# Patient Record
Sex: Female | Born: 1970
Health system: Southern US, Community
[De-identification: ages and names within clinical notes are randomized; demographics above are authoritative.]

## PROBLEM LIST (undated history)

## (undated) DIAGNOSIS — I4719 Other supraventricular tachycardia: Secondary | ICD-10-CM

## (undated) DIAGNOSIS — Z9889 Other specified postprocedural states: Secondary | ICD-10-CM

## (undated) DIAGNOSIS — R87619 Unspecified abnormal cytological findings in specimens from cervix uteri: Secondary | ICD-10-CM

## (undated) DIAGNOSIS — R112 Nausea with vomiting, unspecified: Secondary | ICD-10-CM

## (undated) DIAGNOSIS — I471 Supraventricular tachycardia: Secondary | ICD-10-CM

## (undated) DIAGNOSIS — IMO0002 Reserved for concepts with insufficient information to code with codable children: Secondary | ICD-10-CM

## (undated) HISTORY — PX: WISDOM TOOTH EXTRACTION: SHX21

## (undated) HISTORY — DX: Unspecified abnormal cytological findings in specimens from cervix uteri: R87.619

## (undated) HISTORY — PX: PERINEOPLASTY: SHX2218

## (undated) HISTORY — DX: Other supraventricular tachycardia: I47.19

## (undated) HISTORY — DX: Reserved for concepts with insufficient information to code with codable children: IMO0002

## (undated) HISTORY — PX: LEEP: SHX91

## (undated) HISTORY — DX: Supraventricular tachycardia: I47.1

---

## 1998-07-12 ENCOUNTER — Other Ambulatory Visit: Admission: RE | Admit: 1998-07-12 | Discharge: 1998-07-12 | Payer: Self-pay | Admitting: Family Medicine

## 1999-08-18 ENCOUNTER — Other Ambulatory Visit: Admission: RE | Admit: 1999-08-18 | Discharge: 1999-08-18 | Payer: Self-pay | Admitting: Family Medicine

## 2000-08-23 ENCOUNTER — Other Ambulatory Visit: Admission: RE | Admit: 2000-08-23 | Discharge: 2000-08-23 | Payer: Self-pay | Admitting: Family Medicine

## 2001-06-27 ENCOUNTER — Inpatient Hospital Stay (HOSPITAL_COMMUNITY): Admission: AD | Admit: 2001-06-27 | Discharge: 2001-06-27 | Payer: Self-pay | Admitting: Obstetrics and Gynecology

## 2001-09-02 ENCOUNTER — Inpatient Hospital Stay (HOSPITAL_COMMUNITY): Admission: AD | Admit: 2001-09-02 | Discharge: 2001-09-05 | Payer: Self-pay | Admitting: Obstetrics and Gynecology

## 2001-10-29 ENCOUNTER — Other Ambulatory Visit: Admission: RE | Admit: 2001-10-29 | Discharge: 2001-10-29 | Payer: Self-pay | Admitting: Obstetrics and Gynecology

## 2001-11-19 ENCOUNTER — Ambulatory Visit (HOSPITAL_COMMUNITY): Admission: RE | Admit: 2001-11-19 | Discharge: 2001-11-19 | Payer: Self-pay | Admitting: Obstetrics and Gynecology

## 2002-12-11 ENCOUNTER — Other Ambulatory Visit: Admission: RE | Admit: 2002-12-11 | Discharge: 2002-12-11 | Payer: Self-pay | Admitting: Obstetrics and Gynecology

## 2003-11-23 ENCOUNTER — Other Ambulatory Visit: Admission: RE | Admit: 2003-11-23 | Discharge: 2003-11-23 | Payer: Self-pay | Admitting: Obstetrics and Gynecology

## 2005-04-06 ENCOUNTER — Other Ambulatory Visit: Admission: RE | Admit: 2005-04-06 | Discharge: 2005-04-06 | Payer: Self-pay | Admitting: Obstetrics and Gynecology

## 2006-07-08 ENCOUNTER — Ambulatory Visit (HOSPITAL_COMMUNITY): Admission: RE | Admit: 2006-07-08 | Discharge: 2006-07-08 | Payer: Self-pay | Admitting: Obstetrics and Gynecology

## 2010-09-12 ENCOUNTER — Other Ambulatory Visit: Payer: Self-pay | Admitting: Obstetrics and Gynecology

## 2010-09-12 DIAGNOSIS — N6310 Unspecified lump in the right breast, unspecified quadrant: Secondary | ICD-10-CM

## 2010-09-18 ENCOUNTER — Ambulatory Visit
Admission: RE | Admit: 2010-09-18 | Discharge: 2010-09-18 | Disposition: A | Discharge status: Home / Self Care | Payer: BC Managed Care – PPO | Source: Ambulatory Visit | Attending: Obstetrics and Gynecology | Admitting: Obstetrics and Gynecology

## 2010-09-18 DIAGNOSIS — N6310 Unspecified lump in the right breast, unspecified quadrant: Secondary | ICD-10-CM

## 2011-09-13 ENCOUNTER — Ambulatory Visit: Payer: Self-pay | Admitting: Obstetrics and Gynecology

## 2011-10-25 ENCOUNTER — Encounter: Payer: Self-pay | Admitting: Obstetrics and Gynecology

## 2011-10-25 ENCOUNTER — Ambulatory Visit (INDEPENDENT_AMBULATORY_CARE_PROVIDER_SITE_OTHER): Payer: BC Managed Care – PPO | Admitting: Obstetrics and Gynecology

## 2011-10-25 VITALS — BP 104/64 | Ht 61.6 in | Wt 139.0 lb

## 2011-10-25 DIAGNOSIS — Z01419 Encounter for gynecological examination (general) (routine) without abnormal findings: Secondary | ICD-10-CM

## 2011-10-25 NOTE — Progress Notes (Signed)
The patient reports:normal menses, no abnormal bleeding, pelvic pain or discharge  Contraception:IUD mirena  12/2006  Last mammogram: was normal 2012 Last pap: was normal June  2012  GC/Chlamydia cultures offered: declined HIV/RPR/HbsAg offered:  declined HSV 1 and 2 glycoprotein offered: declined  Menstrual cycle regular and monthly: Yes Menstrual flow normal: Yes  Urinary symptoms: none Normal bowel movements: Yes Reports abuse at home: No:   Subjective:    Carol Conway is a 41 y.o. female, G1P0, who presents for an annual exam. Mirena expires in October 2013: desires new Mirena    History   Social History  . Marital Status: Married    Spouse Name: N/A    Number of Children: N/A  . Years of Education: N/A   Social History Main Topics  . Smoking status: Never Smoker   . Smokeless tobacco: Never Used  . Alcohol Use: No  . Drug Use: No  . Sexually Active: Yes    Birth Control/ Protection: IUD   Other Topics Concern  . None   Social History Narrative  . None    Menstrual cycle:   LMP: Patient's last menstrual period was 10/22/2011.           Cycle: a few days of bloody show with occ PCB  The following portions of the patient's history were reviewed and updated as appropriate: allergies, current medications, past family history, past medical history, past social history, past surgical history and problem list.  Review of Systems Pertinent items are noted in HPI. Breast:Negative for breast lump,nipple discharge or nipple retraction Gastrointestinal: Negative for abdominal pain, change in bowel habits or rectal bleeding Urinary:negative   Objective:    BP 104/64  Ht 5' 1.6" (1.565 m)  Wt 139 lb (63.05 kg)  BMI 25.75 kg/m2  LMP 10/22/2011    Weight:  Wt Readings from Last 1 Encounters:  10/25/11 139 lb (63.05 kg)          BMI: Body mass index is 25.75 kg/(m^2).  General Appearance: Alert, appropriate appearance for age. No acute distress HEENT: Grossly  normal Neck / Thyroid: Supple, no masses, nodes or enlargement Lungs: clear to auscultation bilaterally Back: No CVA tenderness Breast Exam: No masses or nodes.No dimpling, nipple retraction or discharge. Cardiovascular: Regular rate and rhythm. S1, S2, no murmur Gastrointestinal: Soft, non-tender, no masses or organomegaly Pelvic Exam: Vulva and vagina appear normal. Bimanual exam reveals normal uterus and adnexa. Strings + Rectovaginal: not indicated Lymphatic Exam: Non-palpable nodes in neck, clavicular, axillary, or inguinal regions Skin: no rash or abnormalities Neurologic: Normal gait and speech, no tremor  Psychiatric: Alert and oriented, appropriate affect.     Assessment:    Normal gyn exam  USI   Plan:    pap smear return annually or prn Kegels recommended STD screening: declined Contraception:will schedule IUD removal and insertion in September      [facility]

## 2011-10-25 NOTE — Patient Instructions (Signed)
Kegels exercise sheet

## 2011-10-26 LAB — PAP IG W/ RFLX HPV ASCU

## 2011-11-01 ENCOUNTER — Ambulatory Visit: Payer: Self-pay | Admitting: Obstetrics and Gynecology

## 2011-12-04 ENCOUNTER — Encounter: Payer: Self-pay | Admitting: Obstetrics and Gynecology

## 2011-12-04 ENCOUNTER — Ambulatory Visit (INDEPENDENT_AMBULATORY_CARE_PROVIDER_SITE_OTHER): Payer: BC Managed Care – PPO | Admitting: Obstetrics and Gynecology

## 2011-12-04 VITALS — BP 104/74 | Wt 144.0 lb

## 2011-12-04 DIAGNOSIS — Z30433 Encounter for removal and reinsertion of intrauterine contraceptive device: Secondary | ICD-10-CM

## 2011-12-04 DIAGNOSIS — Z975 Presence of (intrauterine) contraceptive device: Secondary | ICD-10-CM

## 2011-12-04 LAB — POCT URINE PREGNANCY: Preg Test, Ur: NEGATIVE

## 2011-12-04 MED ORDER — LEVONORGESTREL 20 MCG/24HR IU IUD
INTRAUTERINE_SYSTEM | Freq: Once | INTRAUTERINE | Status: AC
  Administered 2011-12-04: 1 via INTRAUTERINE

## 2011-12-04 NOTE — Progress Notes (Signed)
IUD INSERTION NOTE   Consent signed after risks and benefits were reviewed including but not limited to bleeding, infection and risk of uterine perforation.  LMP: No LMP recorded. Patient is not currently having periods (Reason: IUD). UPT: Negative GC / Chlamydia: negative  Prepping with Betadine Tenaculum placed on anterior lip of cervix Uterus sounded at  7 cm Insertion of MIRENA IUD per protocol without any complications  POST-PROCEDURE:  Patient instructed to call with fever or excessive pain Patient instructed to check IUD strings after each menstrual cycle   Follow-up: 2 weeks   Lanna Poche MD 12/04/2011 8:45 AM

## 2011-12-20 ENCOUNTER — Encounter: Payer: Self-pay | Admitting: Obstetrics and Gynecology

## 2011-12-20 ENCOUNTER — Ambulatory Visit (INDEPENDENT_AMBULATORY_CARE_PROVIDER_SITE_OTHER): Payer: BC Managed Care – PPO | Admitting: Obstetrics and Gynecology

## 2011-12-20 VITALS — BP 110/68 | Wt 146.0 lb

## 2011-12-20 DIAGNOSIS — Z30431 Encounter for routine checking of intrauterine contraceptive device: Secondary | ICD-10-CM

## 2011-12-20 NOTE — Progress Notes (Signed)
IUD SURVEILLANCE NOTE  MIRENA IUD inserted on 12-04-11  Pain: none Bleeding: none Patient has checked IUD strings: no Other complaints: no  EXAM:  IUD strings visualized: yes              Pelvic exam normal: yes  Next visit with annual exam or 10/2012   Larwance Rote MD 12/20/2011 8:31 AM

## 2012-05-12 ENCOUNTER — Telehealth: Payer: Self-pay | Admitting: Obstetrics and Gynecology

## 2012-05-12 NOTE — Telephone Encounter (Signed)
Spoke with pt rgd concerns pt c/o red swollen painful breast wants eval with SR pt has appt 05/13/12 at 3:40 with SR ok per HD pt voice understanding

## 2012-05-13 ENCOUNTER — Encounter: Payer: Self-pay | Admitting: Obstetrics and Gynecology

## 2012-05-13 ENCOUNTER — Ambulatory Visit: Payer: BC Managed Care – PPO | Admitting: Obstetrics and Gynecology

## 2012-05-13 VITALS — BP 118/60 | Ht 61.0 in | Wt 146.0 lb

## 2012-05-13 DIAGNOSIS — N644 Mastodynia: Secondary | ICD-10-CM

## 2012-05-13 NOTE — Progress Notes (Signed)
Subjective:    Carol Conway is a 42 y.o. female, G1P1001, who presents for a swollen left breast x 4 days. Pt states started period last Monday and noticed when exercising it was painful and then after her shower noticed it was redness on skin on the bottom . Also states that left breast was engorged. Pt also felt swollen lymph node. Pt took Ibuprofen which helped some and redness has eased up. States that she could still see redness yesterday but unable to see as much today.   The following portions of the patient's history were reviewed and updated as appropriate: allergies, current medications, past family history.  Review of Systems Pertinent items are noted in HPI.  Objective:    BP 118/60  Ht 5\' 1"  (1.549 m)  Wt 146 lb (66.225 kg)  BMI 27.6 kg/m2    Weight:  Wt Readings from Last 1 Encounters:  05/13/12 146 lb (66.225 kg)          BMI: Body mass index is 27.6 kg/(m^2).  General Appearance: Alert, appropriate appearance for age. No acute distress Breast exam: Right breast normal. Left breast with 4x4 cm thickened area from 9 to 12 o'clock   Assessment:    Breast thickening    Plan:    Diagnostic mammogram & U/S left breast @ [facility]   Silverio Lay MD

## 2012-05-16 ENCOUNTER — Telehealth: Payer: Self-pay

## 2012-05-16 NOTE — Telephone Encounter (Signed)
Message copied by Darien Ramus on Fri May 16, 2012  2:06 PM ------      Message from: Silverio Lay      Created: Fri May 16, 2012 12:06 PM       It is fine      ----- Message -----         From: Darien Ramus, CMA         Sent: 05/15/2012   8:32 AM           To: Esmeralda Arthur, MD            Pt has been scheduled at the breast center for 1st available diagnostic mmg and u/s of left breast. Apt is on Friday 05/23/2012 @ 8:50 a.m. Pt is wanting to know if this is ok or do we need to try to get her in sooner.            Thanks      Zareya Tuckett D.        ------

## 2012-05-16 NOTE — Telephone Encounter (Signed)
Per SR apt that is scheduled is fine. MB will advise pt  Darien Ramus, CMA

## 2012-05-23 ENCOUNTER — Telehealth: Payer: Self-pay

## 2012-05-23 ENCOUNTER — Other Ambulatory Visit: Payer: Self-pay

## 2012-05-23 NOTE — Telephone Encounter (Signed)
E-mail from pt states that her mmg was rescheduled until 06/04/12. Pt had concerns and wanted to make sure this was ok with SR. After speaking with SR pt was advised that it is ok to wait until then.   Pt voiced understanding  Darien Ramus, CMA

## 2012-06-04 ENCOUNTER — Ambulatory Visit
Admission: RE | Admit: 2012-06-04 | Discharge: 2012-06-04 | Disposition: A | Discharge status: Home / Self Care | Payer: BC Managed Care – PPO | Source: Ambulatory Visit | Attending: Obstetrics and Gynecology | Admitting: Obstetrics and Gynecology

## 2012-06-04 ENCOUNTER — Other Ambulatory Visit: Payer: Self-pay | Admitting: Obstetrics and Gynecology

## 2012-06-04 DIAGNOSIS — N644 Mastodynia: Secondary | ICD-10-CM

## 2013-05-26 ENCOUNTER — Other Ambulatory Visit: Payer: Self-pay

## 2013-05-26 DIAGNOSIS — Z1231 Encounter for screening mammogram for malignant neoplasm of breast: Secondary | ICD-10-CM

## 2013-06-10 ENCOUNTER — Ambulatory Visit
Admission: RE | Admit: 2013-06-10 | Discharge: 2013-06-10 | Disposition: A | Discharge status: Home / Self Care | Payer: BC Managed Care – PPO | Source: Ambulatory Visit

## 2013-06-10 DIAGNOSIS — Z1231 Encounter for screening mammogram for malignant neoplasm of breast: Secondary | ICD-10-CM

## 2014-01-18 ENCOUNTER — Encounter: Payer: Self-pay | Admitting: Obstetrics and Gynecology

## 2015-03-07 ENCOUNTER — Other Ambulatory Visit: Payer: Self-pay | Admitting: Obstetrics and Gynecology

## 2015-03-07 DIAGNOSIS — N63 Unspecified lump in unspecified breast: Secondary | ICD-10-CM

## 2015-03-07 DIAGNOSIS — N6001 Solitary cyst of right breast: Secondary | ICD-10-CM

## 2015-03-11 ENCOUNTER — Other Ambulatory Visit: Payer: Self-pay | Admitting: Obstetrics and Gynecology

## 2015-03-11 ENCOUNTER — Ambulatory Visit
Admission: RE | Admit: 2015-03-11 | Discharge: 2015-03-11 | Disposition: A | Discharge status: Home / Self Care | Payer: BLUE CROSS/BLUE SHIELD | Source: Ambulatory Visit | Attending: Obstetrics and Gynecology | Admitting: Obstetrics and Gynecology

## 2015-03-11 DIAGNOSIS — N63 Unspecified lump in unspecified breast: Secondary | ICD-10-CM

## 2015-03-11 DIAGNOSIS — N6320 Unspecified lump in the left breast, unspecified quadrant: Secondary | ICD-10-CM

## 2015-03-11 DIAGNOSIS — N6001 Solitary cyst of right breast: Secondary | ICD-10-CM

## 2015-03-20 HISTORY — PX: BREAST CYST ASPIRATION: SHX578

## 2015-03-23 ENCOUNTER — Ambulatory Visit
Admission: RE | Admit: 2015-03-23 | Discharge: 2015-03-23 | Disposition: A | Discharge status: Home / Self Care | Payer: BLUE CROSS/BLUE SHIELD | Source: Ambulatory Visit | Attending: Obstetrics and Gynecology | Admitting: Obstetrics and Gynecology

## 2015-03-23 DIAGNOSIS — N6001 Solitary cyst of right breast: Secondary | ICD-10-CM

## 2015-03-23 DIAGNOSIS — N63 Unspecified lump in unspecified breast: Secondary | ICD-10-CM

## 2015-03-26 LAB — CULTURE, ROUTINE-ABSCESS
CULTURE: NO GROWTH
SPECIAL REQUESTS: NORMAL

## 2016-01-03 ENCOUNTER — Other Ambulatory Visit: Payer: Self-pay | Admitting: Obstetrics and Gynecology

## 2016-01-03 DIAGNOSIS — Z1231 Encounter for screening mammogram for malignant neoplasm of breast: Secondary | ICD-10-CM

## 2016-03-27 ENCOUNTER — Ambulatory Visit
Admission: RE | Admit: 2016-03-27 | Discharge: 2016-03-27 | Disposition: A | Discharge status: Home / Self Care | Payer: BLUE CROSS/BLUE SHIELD | Source: Ambulatory Visit | Attending: Obstetrics and Gynecology | Admitting: Obstetrics and Gynecology

## 2016-03-27 DIAGNOSIS — Z1231 Encounter for screening mammogram for malignant neoplasm of breast: Secondary | ICD-10-CM

## 2016-11-01 DIAGNOSIS — Z113 Encounter for screening for infections with a predominantly sexual mode of transmission: Secondary | ICD-10-CM | POA: Diagnosis not present

## 2016-11-01 DIAGNOSIS — N6320 Unspecified lump in the left breast, unspecified quadrant: Secondary | ICD-10-CM | POA: Diagnosis not present

## 2016-11-01 DIAGNOSIS — Z30433 Encounter for removal and reinsertion of intrauterine contraceptive device: Secondary | ICD-10-CM | POA: Diagnosis not present

## 2016-11-15 DIAGNOSIS — N63 Unspecified lump in unspecified breast: Secondary | ICD-10-CM | POA: Diagnosis not present

## 2016-11-15 DIAGNOSIS — Z30431 Encounter for routine checking of intrauterine contraceptive device: Secondary | ICD-10-CM | POA: Diagnosis not present

## 2017-01-18 DIAGNOSIS — Z23 Encounter for immunization: Secondary | ICD-10-CM | POA: Diagnosis not present

## 2017-03-25 DIAGNOSIS — N6019 Diffuse cystic mastopathy of unspecified breast: Secondary | ICD-10-CM | POA: Diagnosis not present

## 2017-03-25 DIAGNOSIS — Z6830 Body mass index (BMI) 30.0-30.9, adult: Secondary | ICD-10-CM | POA: Diagnosis not present

## 2017-03-25 DIAGNOSIS — Z01411 Encounter for gynecological examination (general) (routine) with abnormal findings: Secondary | ICD-10-CM | POA: Diagnosis not present

## 2017-03-25 DIAGNOSIS — Z3040 Encounter for surveillance of contraceptives, unspecified: Secondary | ICD-10-CM | POA: Diagnosis not present

## 2017-03-26 ENCOUNTER — Other Ambulatory Visit: Payer: Self-pay | Admitting: Obstetrics and Gynecology

## 2017-03-26 DIAGNOSIS — N6001 Solitary cyst of right breast: Secondary | ICD-10-CM

## 2017-03-26 DIAGNOSIS — N6002 Solitary cyst of left breast: Secondary | ICD-10-CM

## 2017-04-01 ENCOUNTER — Other Ambulatory Visit: Payer: BLUE CROSS/BLUE SHIELD

## 2017-05-17 HISTORY — PX: BREAST BIOPSY: SHX20

## 2017-05-20 ENCOUNTER — Ambulatory Visit
Admission: RE | Admit: 2017-05-20 | Discharge: 2017-05-20 | Disposition: A | Discharge status: Home / Self Care | Payer: BLUE CROSS/BLUE SHIELD | Source: Ambulatory Visit | Attending: Obstetrics and Gynecology | Admitting: Obstetrics and Gynecology

## 2017-05-20 ENCOUNTER — Ambulatory Visit: Admission: RE | Admit: 2017-05-20 | Payer: BLUE CROSS/BLUE SHIELD | Source: Ambulatory Visit

## 2017-05-20 ENCOUNTER — Other Ambulatory Visit: Payer: Self-pay | Admitting: Obstetrics and Gynecology

## 2017-05-20 DIAGNOSIS — N6001 Solitary cyst of right breast: Secondary | ICD-10-CM

## 2017-05-20 DIAGNOSIS — R922 Inconclusive mammogram: Secondary | ICD-10-CM | POA: Diagnosis not present

## 2017-05-20 DIAGNOSIS — N6002 Solitary cyst of left breast: Secondary | ICD-10-CM

## 2017-05-20 DIAGNOSIS — N6320 Unspecified lump in the left breast, unspecified quadrant: Secondary | ICD-10-CM

## 2017-05-24 ENCOUNTER — Ambulatory Visit
Admission: RE | Admit: 2017-05-24 | Discharge: 2017-05-24 | Disposition: A | Discharge status: Home / Self Care | Payer: BLUE CROSS/BLUE SHIELD | Source: Ambulatory Visit | Attending: Obstetrics and Gynecology | Admitting: Obstetrics and Gynecology

## 2017-05-24 DIAGNOSIS — N6002 Solitary cyst of left breast: Secondary | ICD-10-CM | POA: Diagnosis not present

## 2017-05-24 DIAGNOSIS — N6320 Unspecified lump in the left breast, unspecified quadrant: Secondary | ICD-10-CM

## 2017-05-24 DIAGNOSIS — N6012 Diffuse cystic mastopathy of left breast: Secondary | ICD-10-CM | POA: Diagnosis not present

## 2017-05-24 DIAGNOSIS — N6321 Unspecified lump in the left breast, upper outer quadrant: Secondary | ICD-10-CM | POA: Diagnosis not present

## 2017-05-24 DIAGNOSIS — N6323 Unspecified lump in the left breast, lower outer quadrant: Secondary | ICD-10-CM | POA: Diagnosis not present

## 2017-08-06 DIAGNOSIS — H5213 Myopia, bilateral: Secondary | ICD-10-CM | POA: Diagnosis not present

## 2017-12-16 DIAGNOSIS — Z23 Encounter for immunization: Secondary | ICD-10-CM | POA: Diagnosis not present

## 2018-02-25 ENCOUNTER — Other Ambulatory Visit: Payer: Self-pay | Admitting: Obstetrics and Gynecology

## 2018-02-25 DIAGNOSIS — Z1231 Encounter for screening mammogram for malignant neoplasm of breast: Secondary | ICD-10-CM

## 2018-03-26 DIAGNOSIS — L709 Acne, unspecified: Secondary | ICD-10-CM | POA: Diagnosis not present

## 2018-03-26 DIAGNOSIS — D229 Melanocytic nevi, unspecified: Secondary | ICD-10-CM | POA: Diagnosis not present

## 2018-05-22 ENCOUNTER — Ambulatory Visit
Admission: RE | Admit: 2018-05-22 | Discharge: 2018-05-22 | Disposition: A | Discharge status: Home / Self Care | Payer: BLUE CROSS/BLUE SHIELD | Source: Ambulatory Visit | Attending: Obstetrics and Gynecology | Admitting: Obstetrics and Gynecology

## 2018-05-22 DIAGNOSIS — Z1231 Encounter for screening mammogram for malignant neoplasm of breast: Secondary | ICD-10-CM

## 2018-05-23 ENCOUNTER — Other Ambulatory Visit: Payer: Self-pay | Admitting: Obstetrics and Gynecology

## 2018-05-23 DIAGNOSIS — Z1231 Encounter for screening mammogram for malignant neoplasm of breast: Secondary | ICD-10-CM

## 2018-05-23 DIAGNOSIS — Z13220 Encounter for screening for lipoid disorders: Secondary | ICD-10-CM | POA: Diagnosis not present

## 2018-05-23 DIAGNOSIS — Z8249 Family history of ischemic heart disease and other diseases of the circulatory system: Secondary | ICD-10-CM | POA: Diagnosis not present

## 2018-05-23 DIAGNOSIS — N951 Menopausal and female climacteric states: Secondary | ICD-10-CM | POA: Diagnosis not present

## 2018-05-23 DIAGNOSIS — N63 Unspecified lump in unspecified breast: Secondary | ICD-10-CM

## 2018-05-23 DIAGNOSIS — Z0000 Encounter for general adult medical examination without abnormal findings: Secondary | ICD-10-CM | POA: Diagnosis not present

## 2018-06-12 ENCOUNTER — Ambulatory Visit: Payer: BLUE CROSS/BLUE SHIELD

## 2018-07-10 ENCOUNTER — Ambulatory Visit: Payer: BLUE CROSS/BLUE SHIELD

## 2018-08-18 DIAGNOSIS — H5213 Myopia, bilateral: Secondary | ICD-10-CM | POA: Diagnosis not present

## 2018-08-26 ENCOUNTER — Other Ambulatory Visit: Payer: Self-pay

## 2018-08-26 ENCOUNTER — Ambulatory Visit
Admission: RE | Admit: 2018-08-26 | Discharge: 2018-08-26 | Disposition: A | Discharge status: Home / Self Care | Payer: BC Managed Care – PPO | Source: Ambulatory Visit | Attending: Obstetrics and Gynecology | Admitting: Obstetrics and Gynecology

## 2018-08-26 DIAGNOSIS — Z1231 Encounter for screening mammogram for malignant neoplasm of breast: Secondary | ICD-10-CM | POA: Diagnosis not present

## 2018-09-02 DIAGNOSIS — Z23 Encounter for immunization: Secondary | ICD-10-CM | POA: Diagnosis not present

## 2019-02-18 DIAGNOSIS — L309 Dermatitis, unspecified: Secondary | ICD-10-CM | POA: Diagnosis not present

## 2019-04-06 DIAGNOSIS — Z01411 Encounter for gynecological examination (general) (routine) with abnormal findings: Secondary | ICD-10-CM | POA: Diagnosis not present

## 2019-04-06 DIAGNOSIS — Z124 Encounter for screening for malignant neoplasm of cervix: Secondary | ICD-10-CM | POA: Diagnosis not present

## 2019-04-06 DIAGNOSIS — Z6831 Body mass index (BMI) 31.0-31.9, adult: Secondary | ICD-10-CM | POA: Diagnosis not present

## 2019-04-06 DIAGNOSIS — Z3040 Encounter for surveillance of contraceptives, unspecified: Secondary | ICD-10-CM | POA: Diagnosis not present

## 2019-10-08 DIAGNOSIS — H5213 Myopia, bilateral: Secondary | ICD-10-CM | POA: Diagnosis not present

## 2019-12-07 DIAGNOSIS — Z20822 Contact with and (suspected) exposure to covid-19: Secondary | ICD-10-CM | POA: Diagnosis not present

## 2020-03-09 DIAGNOSIS — Z20822 Contact with and (suspected) exposure to covid-19: Secondary | ICD-10-CM | POA: Diagnosis not present

## 2020-03-19 HISTORY — PX: BREAST CYST ASPIRATION: SHX578

## 2020-04-05 DIAGNOSIS — Z1231 Encounter for screening mammogram for malignant neoplasm of breast: Secondary | ICD-10-CM | POA: Diagnosis not present

## 2020-04-19 DIAGNOSIS — Z8249 Family history of ischemic heart disease and other diseases of the circulatory system: Secondary | ICD-10-CM | POA: Diagnosis not present

## 2020-04-19 DIAGNOSIS — R1115 Cyclical vomiting syndrome unrelated to migraine: Secondary | ICD-10-CM | POA: Diagnosis not present

## 2020-04-19 DIAGNOSIS — Z3040 Encounter for surveillance of contraceptives, unspecified: Secondary | ICD-10-CM | POA: Diagnosis not present

## 2020-04-19 DIAGNOSIS — Z1231 Encounter for screening mammogram for malignant neoplasm of breast: Secondary | ICD-10-CM | POA: Diagnosis not present

## 2020-04-19 DIAGNOSIS — R922 Inconclusive mammogram: Secondary | ICD-10-CM | POA: Diagnosis not present

## 2020-04-19 DIAGNOSIS — Z6828 Body mass index (BMI) 28.0-28.9, adult: Secondary | ICD-10-CM | POA: Diagnosis not present

## 2020-04-19 DIAGNOSIS — Z01411 Encounter for gynecological examination (general) (routine) with abnormal findings: Secondary | ICD-10-CM | POA: Diagnosis not present

## 2020-04-19 DIAGNOSIS — N6310 Unspecified lump in the right breast, unspecified quadrant: Secondary | ICD-10-CM | POA: Diagnosis not present

## 2020-04-28 DIAGNOSIS — E669 Obesity, unspecified: Secondary | ICD-10-CM | POA: Diagnosis not present

## 2020-04-28 DIAGNOSIS — R079 Chest pain, unspecified: Secondary | ICD-10-CM | POA: Diagnosis not present

## 2020-04-28 DIAGNOSIS — Z1211 Encounter for screening for malignant neoplasm of colon: Secondary | ICD-10-CM | POA: Diagnosis not present

## 2020-04-28 DIAGNOSIS — R748 Abnormal levels of other serum enzymes: Secondary | ICD-10-CM | POA: Diagnosis not present

## 2020-04-28 DIAGNOSIS — R1013 Epigastric pain: Secondary | ICD-10-CM | POA: Diagnosis not present

## 2020-04-29 ENCOUNTER — Other Ambulatory Visit: Payer: Self-pay | Admitting: Gastroenterology

## 2020-04-29 DIAGNOSIS — R1013 Epigastric pain: Secondary | ICD-10-CM

## 2020-05-02 DIAGNOSIS — N6019 Diffuse cystic mastopathy of unspecified breast: Secondary | ICD-10-CM | POA: Diagnosis not present

## 2020-05-02 DIAGNOSIS — R922 Inconclusive mammogram: Secondary | ICD-10-CM | POA: Diagnosis not present

## 2020-05-02 DIAGNOSIS — Z0000 Encounter for general adult medical examination without abnormal findings: Secondary | ICD-10-CM | POA: Diagnosis not present

## 2020-05-05 ENCOUNTER — Other Ambulatory Visit: Payer: Self-pay | Admitting: Gastroenterology

## 2020-05-05 ENCOUNTER — Ambulatory Visit
Admission: RE | Admit: 2020-05-05 | Discharge: 2020-05-05 | Disposition: A | Discharge status: Home / Self Care | Payer: BC Managed Care – PPO | Source: Ambulatory Visit | Attending: Gastroenterology | Admitting: Gastroenterology

## 2020-05-05 ENCOUNTER — Other Ambulatory Visit: Payer: Self-pay | Admitting: Obstetrics and Gynecology

## 2020-05-05 DIAGNOSIS — R1013 Epigastric pain: Secondary | ICD-10-CM

## 2020-05-05 DIAGNOSIS — K8020 Calculus of gallbladder without cholecystitis without obstruction: Secondary | ICD-10-CM | POA: Diagnosis not present

## 2020-05-11 ENCOUNTER — Ambulatory Visit: Payer: Self-pay | Admitting: Surgery

## 2020-05-11 DIAGNOSIS — K8050 Calculus of bile duct without cholangitis or cholecystitis without obstruction: Secondary | ICD-10-CM | POA: Diagnosis not present

## 2020-05-11 NOTE — H&P (View-Only) (Signed)
Surgical Evaluation  Chief Complaint: abdominal pain  HPI: This is a very pleasant otherwise healthy 50 year old woman who has had 2 episodes of severe epigastric and right upper quadrant pain. The first was last month and lasted for about 3 hours, the second was a few weeks ago and lasted for about 6 hours.  Each episode is associated with chills, nausea, and emesis followed by relief of the pain.  Denies any known fevers, denies any jaundice, denies any preceding symptoms of indigestion or abdominal pain.  She's never had any abdominal surgery.  She did undergo an ultrasound that demonstrated cholelithiasis.  Labs performed last week were unremarkable with the exception of elevation of AST and ALT to around 200-300.  Bilirubin was normal.  Allergies  Allergen Reactions  . Codeine     Past Medical History:  Diagnosis Date  . Abnormal pap     Past Surgical History:  Procedure Laterality Date  . BREAST BIOPSY Left 05/2017  . BREAST CYST ASPIRATION Left 03/2015  . LEEP    . PERINEOPLASTY      Family History  Problem Relation Age of Onset  . Heart disease Father   . Ovarian cancer Maternal Grandmother   . Breast cancer Paternal Grandmother   . Parkinsonism Paternal Grandmother   . Ovarian cancer Paternal Grandmother     Social History   Socioeconomic History  . Marital status: Married    Spouse name: Not on file  . Number of children: Not on file  . Years of education: Not on file  . Highest education level: Not on file  Occupational History  . Not on file  Tobacco Use  . Smoking status: Never Smoker  . Smokeless tobacco: Never Used  Substance and Sexual Activity  . Alcohol use: No  . Drug use: No  . Sexual activity: Yes    Birth control/protection: I.U.D.  Other Topics Concern  . Not on file  Social History Narrative  . Not on file   Social Determinants of Health   Financial Resource Strain: Not on file  Food Insecurity: Not on file  Transportation Needs: Not  on file  Physical Activity: Not on file  Stress: Not on file  Social Connections: Not on file    No current outpatient medications on file prior to visit.   No current facility-administered medications on file prior to visit.    Review of Systems: a complete, 10pt review of systems was completed with pertinent positives and negatives as documented in the HPI  Physical Exam: Vitals Weight: 164.5 lb Height: 64.5in Body Surface Area: 1.81 m Body Mass Index: 27.8 kg/m  Temp.: 98.33F  P.OX: 99% (Room air) BP: 136/82(Sitting, Left Arm, Standard)  Alert and well-appearing Unlabored respirations Abdomen is soft, nontender, nondistended. No mass, no hernia, no organomegaly.   No flowsheet data found.  No flowsheet data found.  No results found for: INR, PROTIME  Imaging: No results found.   A/P: BILIARY COLIC (K80.50) Story: 2 fairly severe episodes. I recommend proceeding with laparoscopic cholecystectomy with possible cholangiogram. Discussed risks of surgery including bleeding, pain, scarring, intraabdominal injury specifically to the common bile duct and sequelae, bile leak, conversion to open surgery, failure to resolve symptoms, blood clots/ pulmonary embolus, heart attack, pneumonia, stroke, death. Questions welcomed and answered to patient's satisfaction. She wishes to proceed with surgery and we will schedule her as soon as possible. We went over signs and symptoms that should prompt her to seek emergency treatment if she has a  recurrent episode in the interim.    There are no problems to display for this patient.      Phylliss Blakes, MD [facility], [place]  See AMION to contact appropriate on-call provider

## 2020-05-11 NOTE — H&P (Signed)
Surgical Evaluation  Chief Complaint: abdominal pain  HPI: This is a very pleasant otherwise healthy 49-year-old woman who has had 2 episodes of severe epigastric and right upper quadrant pain. The first was last month and lasted for about 3 hours, the second was a few weeks ago and lasted for about 6 hours.  Each episode is associated with chills, nausea, and emesis followed by relief of the pain.  Denies any known fevers, denies any jaundice, denies any preceding symptoms of indigestion or abdominal pain.  She's never had any abdominal surgery.  She did undergo an ultrasound that demonstrated cholelithiasis.  Labs performed last week were unremarkable with the exception of elevation of AST and ALT to around 200-300.  Bilirubin was normal.  Allergies  Allergen Reactions  . Codeine     Past Medical History:  Diagnosis Date  . Abnormal pap     Past Surgical History:  Procedure Laterality Date  . BREAST BIOPSY Left 05/2017  . BREAST CYST ASPIRATION Left 03/2015  . LEEP    . PERINEOPLASTY      Family History  Problem Relation Age of Onset  . Heart disease Father   . Ovarian cancer Maternal Grandmother   . Breast cancer Paternal Grandmother   . Parkinsonism Paternal Grandmother   . Ovarian cancer Paternal Grandmother     Social History   Socioeconomic History  . Marital status: Married    Spouse name: Not on file  . Number of children: Not on file  . Years of education: Not on file  . Highest education level: Not on file  Occupational History  . Not on file  Tobacco Use  . Smoking status: Never Smoker  . Smokeless tobacco: Never Used  Substance and Sexual Activity  . Alcohol use: No  . Drug use: No  . Sexual activity: Yes    Birth control/protection: I.U.D.  Other Topics Concern  . Not on file  Social History Narrative  . Not on file   Social Determinants of Health   Financial Resource Strain: Not on file  Food Insecurity: Not on file  Transportation Needs: Not  on file  Physical Activity: Not on file  Stress: Not on file  Social Connections: Not on file    No current outpatient medications on file prior to visit.   No current facility-administered medications on file prior to visit.    Review of Systems: a complete, 10pt review of systems was completed with pertinent positives and negatives as documented in the HPI  Physical Exam: Vitals Weight: 164.5 lb Height: 64.5in Body Surface Area: 1.81 m Body Mass Index: 27.8 kg/m  Temp.: 98.2F  P.OX: 99% (Room air) BP: 136/82(Sitting, Left Arm, Standard)  Alert and well-appearing Unlabored respirations Abdomen is soft, nontender, nondistended. No mass, no hernia, no organomegaly.   No flowsheet data found.  No flowsheet data found.  No results found for: INR, PROTIME  Imaging: No results found.   A/P: BILIARY COLIC (K80.50) Story: 2 fairly severe episodes. I recommend proceeding with laparoscopic cholecystectomy with possible cholangiogram. Discussed risks of surgery including bleeding, pain, scarring, intraabdominal injury specifically to the common bile duct and sequelae, bile leak, conversion to open surgery, failure to resolve symptoms, blood clots/ pulmonary embolus, heart attack, pneumonia, stroke, death. Questions welcomed and answered to patient's satisfaction. She wishes to proceed with surgery and we will schedule her as soon as possible. We went over signs and symptoms that should prompt her to seek emergency treatment if she has a   recurrent episode in the interim.    There are no problems to display for this patient.      Phylliss Blakes, MD [facility], [place]  See AMION to contact appropriate on-call provider

## 2020-05-13 NOTE — Patient Instructions (Addendum)
DUE TO COVID-19 ONLY ONE VISITOR IS ALLOWED TO COME WITH YOU AND STAY IN THE WAITING ROOM ONLY DURING PRE OP AND PROCEDURE.     COVID SWAB TESTING MUST BE COMPLETED ON:  Friday, 05-20-20 @ 1:15 PM   [address] [place], [place] [address]  (Must self quarantine after testing. Follow instructions on handout.)        Your procedure is scheduled on:   Tuesday, 05-24-20   Report to [facility] Main  Entrance   Report to Short Stay at 5:30 AM   [facility])   Call this number if you have problems the morning of surgery [phone]   Do not eat food :After Midnight.   May have liquids until 4:30 AM   day of surgery  CLEAR LIQUID DIET  Foods Allowed                                                                     Foods Excluded  Water, Black Coffee and tea, regular and decaf            liquids that you cannot  Plain Jell-O in any flavor  (No red)                                   see through such as: Fruit ices (not with fruit pulp)                                      milk, soups, orange juice              Iced Popsicles (No red)                                      All solid food                                   Apple juices Sports drinks like Gatorade (No red) Lightly seasoned clear broth or consume(fat free)  Sugar, honey syrup     Oral Hygiene is also important to reduce your risk of infection.                                    Remember - BRUSH YOUR TEETH THE MORNING OF SURGERY WITH YOUR REGULAR TOOTHPASTE   Do NOT smoke after Midnight   Take these medicines the morning of surgery with A SIP OF WATER:   None                               You may not have any metal on your body including hair pins, jewelry, and body piercings             Do not wear make-up, lotions, powders, perfumes/cologne, or deodorant  Do not wear nail polish.  Do not shave  48 hours prior to surgery.             Do not bring valuables to the hospital. [employer] IS NOT              RESPONSIBLE   FOR VALUABLES.   Contacts, dentures or bridgework may not be worn into surgery.     Patients discharged the day of surgery will not be allowed to drive home.                Please read over the following fact sheets you were given: IF YOU HAVE QUESTIONS ABOUT YOUR PRE OP INSTRUCTIONS PLEASE CALL  [phone] [facility] - Preparing for Surgery Before surgery, you can play an important role.  Because skin is not sterile, your skin needs to be as free of germs as possible.  You can reduce the number of germs on your skin by washing with CHG (chlorahexidine gluconate) soap before surgery.  CHG is an antiseptic cleaner which kills germs and bonds with the skin to continue killing germs even after washing. Please DO NOT use if you have an allergy to CHG or antibacterial soaps.  If your skin becomes reddened/irritated stop using the CHG and inform your nurse when you arrive at Short Stay. Do not shave (including legs and underarms) for at least 48 hours prior to the first CHG shower.  You may shave your face/neck.  Please follow these instructions carefully:  1.  Shower with CHG Soap the night before surgery and the  morning of surgery.  2.  If you choose to wash your hair, wash your hair first as usual with your normal  shampoo.  3.  After you shampoo, rinse your hair and body thoroughly to remove the shampoo.                             4.  Use CHG as you would any other liquid soap.  You can apply chg directly to the skin and wash.  Gently with a scrungie or clean washcloth.  5.  Apply the CHG Soap to your body ONLY FROM THE NECK DOWN.   Do   not use on face/ open                           Wound or open sores. Avoid contact with eyes, ears mouth and   genitals (private parts).                       Wash face,  Genitals (private parts) with your normal soap.             6.  Wash thoroughly, paying special attention to the area where your    surgery  will be  performed.  7.  Thoroughly rinse your body with warm water from the neck down.  8.  DO NOT shower/wash with your normal soap after using and rinsing off the CHG Soap.                9.  Pat yourself dry with a clean towel.            10.  Wear clean pajamas.            11.  Place clean sheets on your bed the night of  your first shower and do not  sleep with pets. Day of Surgery : Do not apply any lotions/deodorants the morning of surgery.  Please wear clean clothes to the hospital/surgery center.  FAILURE TO FOLLOW THESE INSTRUCTIONS MAY RESULT IN THE CANCELLATION OF YOUR SURGERY  PATIENT SIGNATURE_________________________________  NURSE SIGNATURE__________________________________  ________________________________________________________________________

## 2020-05-13 NOTE — Progress Notes (Addendum)
COVID Vaccine Completed:  x3 Date COVID Vaccine completed:  06-10-19 07-01-19 Has received booster:  04-30-27-21 COVID vaccine manufacturer: Pfizer      Date of COVID positive in last 90 days:  N/A  PCP - Ollen Gross Cardiologist - N/A  Chest x-ray - N/A EKG - N/A Stress Test -  ECHO -  Cardiac Cath -  Pacemaker/ICD device last checked:  Sleep Study - N/A CPAP -   Fasting Blood Sugar - N/A Checks Blood Sugar _____ times a day  Blood Thinner Instructions: Aspirin Instructions:N/A Last Dose:  Activity level:  Can go up a flight of stairs and perform activities of daily living without stopping and without symptoms.  Able to exercise without symptoms     Anesthesia review:  N/A  Patient denies shortness of breath, fever, cough and chest pain at PAT appointment   Patient verbalized understanding of instructions that were given to them at the PAT appointment. Patient was also instructed that they will need to review over the PAT instructions again at home before surgery.

## 2020-05-16 ENCOUNTER — Other Ambulatory Visit: Payer: BC Managed Care – PPO

## 2020-05-17 ENCOUNTER — Encounter (HOSPITAL_COMMUNITY): Payer: Self-pay

## 2020-05-17 ENCOUNTER — Encounter (HOSPITAL_COMMUNITY)
Admission: RE | Admit: 2020-05-17 | Discharge: 2020-05-17 | Disposition: A | Discharge status: Home / Self Care | Payer: BC Managed Care – PPO | Source: Ambulatory Visit | Attending: Surgery | Admitting: Surgery

## 2020-05-17 ENCOUNTER — Other Ambulatory Visit: Payer: Self-pay

## 2020-05-17 DIAGNOSIS — Z01812 Encounter for preprocedural laboratory examination: Secondary | ICD-10-CM | POA: Insufficient documentation

## 2020-05-17 HISTORY — DX: Nausea with vomiting, unspecified: R11.2

## 2020-05-17 HISTORY — DX: Other specified postprocedural states: Z98.890

## 2020-05-17 LAB — CBC
HCT: 37.9 % (ref 36.0–46.0)
Hemoglobin: 11.9 g/dL — ABNORMAL LOW (ref 12.0–15.0)
MCH: 26 pg (ref 26.0–34.0)
MCHC: 31.4 g/dL (ref 30.0–36.0)
MCV: 82.9 fL (ref 80.0–100.0)
Platelets: 233 10*3/uL (ref 150–400)
RBC: 4.57 MIL/uL (ref 3.87–5.11)
RDW: 14 % (ref 11.5–15.5)
WBC: 8.6 10*3/uL (ref 4.0–10.5)
nRBC: 0 % (ref 0.0–0.2)

## 2020-05-20 ENCOUNTER — Other Ambulatory Visit (HOSPITAL_COMMUNITY)
Admission: RE | Admit: 2020-05-20 | Discharge: 2020-05-20 | Disposition: A | Discharge status: Home / Self Care | Payer: BC Managed Care – PPO | Source: Ambulatory Visit | Attending: Surgery | Admitting: Surgery

## 2020-05-20 ENCOUNTER — Telehealth: Payer: Self-pay

## 2020-05-20 DIAGNOSIS — Z20822 Contact with and (suspected) exposure to covid-19: Secondary | ICD-10-CM | POA: Diagnosis not present

## 2020-05-20 DIAGNOSIS — Z01812 Encounter for preprocedural laboratory examination: Secondary | ICD-10-CM | POA: Diagnosis not present

## 2020-05-20 LAB — SARS CORONAVIRUS 2 (TAT 6-24 HRS): SARS Coronavirus 2: NEGATIVE

## 2020-05-20 NOTE — Telephone Encounter (Signed)
NOTES ON FILE FROM DR SANDRA RIVARD [phone], SENT REFERRAL TO SCHEDULING 

## 2020-05-23 ENCOUNTER — Encounter (HOSPITAL_COMMUNITY): Payer: Self-pay | Admitting: Surgery

## 2020-05-23 MED ORDER — BUPIVACAINE LIPOSOME 1.3 % IJ SUSP
20.0000 mL | Freq: Once | INTRAMUSCULAR | Status: DC
  Filled 2020-05-23: qty 20

## 2020-05-23 NOTE — Anesthesia Preprocedure Evaluation (Addendum)
Anesthesia Evaluation  Patient identified by MRN, date of birth, ID band Patient awake    Reviewed: Allergy & Precautions, NPO status , Patient's Chart, lab work & pertinent test results  History of Anesthesia Complications Negative for: history of anesthetic complications  Airway Mallampati: II  TM Distance: >3 FB Neck ROM: Full    Dental  (+) Dental Advisory Given   Pulmonary former smoker,  05/20/2020 SARS coronavirus NEG   breath sounds clear to auscultation       Cardiovascular negative cardio ROS   Rhythm:Regular Rate:Normal     Neuro/Psych negative neurological ROS     GI/Hepatic negative GI ROS, Biliary cholestasis   Endo/Other  obese  Renal/GU negative Renal ROS     Musculoskeletal   Abdominal (+) + obese,   Peds  Hematology negative hematology ROS (+)   Anesthesia Other Findings   Reproductive/Obstetrics                            Anesthesia Physical Anesthesia Plan  ASA: II  Anesthesia Plan: General   Post-op Pain Management:    Induction: Intravenous and Rapid sequence  PONV Risk Score and Plan: 4 or greater and Scopolamine patch - Pre-op, Ondansetron and Dexamethasone  Airway Management Planned: Oral ETT  Additional Equipment: None  Intra-op Plan:   Post-operative Plan: Extubation in OR  Informed Consent: I have reviewed the patients History and Physical, chart, labs and discussed the procedure including the risks, benefits and alternatives for the proposed anesthesia with the patient or authorized representative who has indicated his/her understanding and acceptance.     Dental advisory given  Plan Discussed with:   Anesthesia Plan Comments:        Anesthesia Quick Evaluation

## 2020-05-24 ENCOUNTER — Ambulatory Visit (HOSPITAL_COMMUNITY): Payer: BC Managed Care – PPO | Admitting: Anesthesiology

## 2020-05-24 ENCOUNTER — Encounter (HOSPITAL_COMMUNITY): Payer: Self-pay | Admitting: Surgery

## 2020-05-24 ENCOUNTER — Encounter (HOSPITAL_COMMUNITY)
Admission: RE | Disposition: A | Discharge status: Home / Self Care | Payer: Self-pay | Source: Ambulatory Visit | Attending: Surgery

## 2020-05-24 ENCOUNTER — Ambulatory Visit (HOSPITAL_COMMUNITY)
Admission: RE | Admit: 2020-05-24 | Discharge: 2020-05-24 | Disposition: A | Discharge status: Home / Self Care | Payer: BC Managed Care – PPO | Source: Ambulatory Visit | Attending: Surgery | Admitting: Surgery

## 2020-05-24 DIAGNOSIS — Z885 Allergy status to narcotic agent status: Secondary | ICD-10-CM | POA: Diagnosis not present

## 2020-05-24 DIAGNOSIS — K8044 Calculus of bile duct with chronic cholecystitis without obstruction: Secondary | ICD-10-CM | POA: Insufficient documentation

## 2020-05-24 DIAGNOSIS — K8051 Calculus of bile duct without cholangitis or cholecystitis with obstruction: Secondary | ICD-10-CM | POA: Diagnosis not present

## 2020-05-24 DIAGNOSIS — K8010 Calculus of gallbladder with chronic cholecystitis without obstruction: Secondary | ICD-10-CM | POA: Diagnosis not present

## 2020-05-24 HISTORY — PX: CHOLECYSTECTOMY: SHX55

## 2020-05-24 LAB — PREGNANCY, URINE: Preg Test, Ur: NEGATIVE

## 2020-05-24 SURGERY — LAPAROSCOPIC CHOLECYSTECTOMY WITH INTRAOPERATIVE CHOLANGIOGRAM
Anesthesia: General | Site: Abdomen

## 2020-05-24 MED ORDER — FENTANYL CITRATE (PF) 250 MCG/5ML IJ SOLN
INTRAMUSCULAR | Status: AC
  Filled 2020-05-24: qty 5

## 2020-05-24 MED ORDER — DOCUSATE SODIUM 100 MG PO CAPS: 100.0000 mg | ORAL_CAPSULE | Freq: Two times a day (BID) | ORAL | 0 refills | Status: DC

## 2020-05-24 MED ORDER — CHLORHEXIDINE GLUCONATE 0.12 % MT SOLN
15.0000 mL | Freq: Once | OROMUCOSAL | Status: AC
  Administered 2020-05-24: 15 mL via OROMUCOSAL

## 2020-05-24 MED ORDER — ORAL CARE MOUTH RINSE: 15.0000 mL | Freq: Once | OROMUCOSAL | Status: AC

## 2020-05-24 MED ORDER — BUPIVACAINE-EPINEPHRINE 0.25% -1:200000 IJ SOLN
INTRAMUSCULAR | Status: DC | PRN
  Administered 2020-05-24: 30 mL

## 2020-05-24 MED ORDER — CEFAZOLIN SODIUM-DEXTROSE 2-4 GM/100ML-% IV SOLN
2.0000 g | INTRAVENOUS | Status: AC
  Administered 2020-05-24: 2 g via INTRAVENOUS
  Filled 2020-05-24: qty 100

## 2020-05-24 MED ORDER — HYDROMORPHONE HCL 1 MG/ML IJ SOLN
INTRAMUSCULAR | Status: AC
  Filled 2020-05-24: qty 1

## 2020-05-24 MED ORDER — LIDOCAINE 2% (20 MG/ML) 5 ML SYRINGE
INTRAMUSCULAR | Status: AC
  Filled 2020-05-24: qty 10

## 2020-05-24 MED ORDER — OXYCODONE HCL 5 MG PO TABS
5.0000 mg | ORAL_TABLET | Freq: Once | ORAL | Status: AC | PRN
  Administered 2020-05-24: 5 mg via ORAL

## 2020-05-24 MED ORDER — PHENYLEPHRINE 40 MCG/ML (10ML) SYRINGE FOR IV PUSH (FOR BLOOD PRESSURE SUPPORT)
PREFILLED_SYRINGE | INTRAVENOUS | Status: AC
  Filled 2020-05-24: qty 10

## 2020-05-24 MED ORDER — EPHEDRINE SULFATE-NACL 50-0.9 MG/10ML-% IV SOSY
PREFILLED_SYRINGE | INTRAVENOUS | Status: DC | PRN
  Administered 2020-05-24 (×2): 10 mg via INTRAVENOUS

## 2020-05-24 MED ORDER — DEXAMETHASONE SODIUM PHOSPHATE 10 MG/ML IJ SOLN
INTRAMUSCULAR | Status: AC
  Filled 2020-05-24: qty 2

## 2020-05-24 MED ORDER — TRAMADOL HCL 50 MG PO TABS: 50.0000 mg | ORAL_TABLET | Freq: Four times a day (QID) | ORAL | 0 refills | Status: DC | PRN

## 2020-05-24 MED ORDER — MIDAZOLAM HCL 5 MG/5ML IJ SOLN
INTRAMUSCULAR | Status: DC | PRN
  Administered 2020-05-24: 2 mg via INTRAVENOUS

## 2020-05-24 MED ORDER — FENTANYL CITRATE (PF) 100 MCG/2ML IJ SOLN
INTRAMUSCULAR | Status: DC | PRN
  Administered 2020-05-24: 100 ug via INTRAVENOUS

## 2020-05-24 MED ORDER — ONDANSETRON HCL 4 MG/2ML IJ SOLN
INTRAMUSCULAR | Status: AC
  Filled 2020-05-24: qty 4

## 2020-05-24 MED ORDER — LIDOCAINE 2% (20 MG/ML) 5 ML SYRINGE
INTRAMUSCULAR | Status: DC | PRN
  Administered 2020-05-24: 80 mg via INTRAVENOUS

## 2020-05-24 MED ORDER — DEXAMETHASONE SODIUM PHOSPHATE 10 MG/ML IJ SOLN
INTRAMUSCULAR | Status: DC | PRN
  Administered 2020-05-24: 8 mg via INTRAVENOUS

## 2020-05-24 MED ORDER — LACTATED RINGERS IR SOLN
Status: DC | PRN
  Administered 2020-05-24: 1000 mL

## 2020-05-24 MED ORDER — ACETAMINOPHEN 500 MG PO TABS
1000.0000 mg | ORAL_TABLET | ORAL | Status: AC
  Administered 2020-05-24: 1000 mg via ORAL
  Filled 2020-05-24: qty 2

## 2020-05-24 MED ORDER — OXYCODONE HCL 5 MG PO TABS
ORAL_TABLET | ORAL | Status: AC
  Filled 2020-05-24: qty 1

## 2020-05-24 MED ORDER — PROPOFOL 10 MG/ML IV BOLUS
INTRAVENOUS | Status: AC
  Filled 2020-05-24: qty 60

## 2020-05-24 MED ORDER — SUGAMMADEX SODIUM 200 MG/2ML IV SOLN
INTRAVENOUS | Status: DC | PRN
  Administered 2020-05-24: 200 mg via INTRAVENOUS

## 2020-05-24 MED ORDER — SODIUM CHLORIDE 0.9% FLUSH: 3.0000 mL | INTRAVENOUS | Status: DC | PRN

## 2020-05-24 MED ORDER — SODIUM CHLORIDE 0.9 % IV SOLN: 250.0000 mL | INTRAVENOUS | Status: DC | PRN

## 2020-05-24 MED ORDER — MIDAZOLAM HCL 2 MG/2ML IJ SOLN
INTRAMUSCULAR | Status: AC
  Filled 2020-05-24: qty 2

## 2020-05-24 MED ORDER — CHLORHEXIDINE GLUCONATE 4 % EX LIQD: 60.0000 mL | Freq: Once | CUTANEOUS | Status: DC

## 2020-05-24 MED ORDER — SODIUM CHLORIDE 0.9% FLUSH: 3.0000 mL | Freq: Two times a day (BID) | INTRAVENOUS | Status: DC

## 2020-05-24 MED ORDER — BUPIVACAINE-EPINEPHRINE (PF) 0.25% -1:200000 IJ SOLN
INTRAMUSCULAR | Status: AC
  Filled 2020-05-24: qty 30

## 2020-05-24 MED ORDER — OXYCODONE HCL 5 MG/5ML PO SOLN: 5.0000 mg | Freq: Once | ORAL | Status: AC | PRN

## 2020-05-24 MED ORDER — 0.9 % SODIUM CHLORIDE (POUR BTL) OPTIME
TOPICAL | Status: DC | PRN
  Administered 2020-05-24: 1000 mL

## 2020-05-24 MED ORDER — PHENYLEPHRINE 40 MCG/ML (10ML) SYRINGE FOR IV PUSH (FOR BLOOD PRESSURE SUPPORT)
PREFILLED_SYRINGE | INTRAVENOUS | Status: DC | PRN
  Administered 2020-05-24 (×2): 80 ug via INTRAVENOUS

## 2020-05-24 MED ORDER — ROCURONIUM BROMIDE 10 MG/ML (PF) SYRINGE
PREFILLED_SYRINGE | INTRAVENOUS | Status: DC | PRN
  Administered 2020-05-24: 60 mg via INTRAVENOUS

## 2020-05-24 MED ORDER — PROPOFOL 10 MG/ML IV BOLUS
INTRAVENOUS | Status: DC | PRN
  Administered 2020-05-24: 200 mg via INTRAVENOUS

## 2020-05-24 MED ORDER — MIDAZOLAM HCL 2 MG/2ML IJ SOLN: 0.5000 mg | Freq: Once | INTRAMUSCULAR | Status: DC | PRN

## 2020-05-24 MED ORDER — MEPERIDINE HCL 50 MG/ML IJ SOLN: 6.2500 mg | INTRAMUSCULAR | Status: DC | PRN

## 2020-05-24 MED ORDER — SCOPOLAMINE 1 MG/3DAYS TD PT72
1.0000 | MEDICATED_PATCH | TRANSDERMAL | Status: DC
  Administered 2020-05-24: 1.5 mg via TRANSDERMAL
  Filled 2020-05-24: qty 1

## 2020-05-24 MED ORDER — GABAPENTIN 300 MG PO CAPS
300.0000 mg | ORAL_CAPSULE | ORAL | Status: AC
  Administered 2020-05-24: 300 mg via ORAL
  Filled 2020-05-24: qty 1

## 2020-05-24 MED ORDER — ROCURONIUM BROMIDE 10 MG/ML (PF) SYRINGE
PREFILLED_SYRINGE | INTRAVENOUS | Status: AC
  Filled 2020-05-24: qty 20

## 2020-05-24 MED ORDER — HYDROMORPHONE HCL 1 MG/ML IJ SOLN
0.2500 mg | INTRAMUSCULAR | Status: DC | PRN
  Administered 2020-05-24 (×4): 0.5 mg via INTRAVENOUS

## 2020-05-24 MED ORDER — ONDANSETRON HCL 4 MG/2ML IJ SOLN
INTRAMUSCULAR | Status: DC | PRN
  Administered 2020-05-24: 4 mg via INTRAVENOUS

## 2020-05-24 MED ORDER — LACTATED RINGERS IV SOLN: INTRAVENOUS | Status: DC

## 2020-05-24 MED ORDER — PROMETHAZINE HCL 25 MG/ML IJ SOLN: 6.2500 mg | INTRAMUSCULAR | Status: DC | PRN

## 2020-05-24 SURGICAL SUPPLY — 41 items
ADH SKN CLS APL DERMABOND .7 (GAUZE/BANDAGES/DRESSINGS) ×1
APL PRP STRL LF DISP 70% ISPRP (MISCELLANEOUS) ×1
APPLIER CLIP ROT 10 11.4 M/L (STAPLE) ×2
APR CLP MED LRG 11.4X10 (STAPLE) ×1
BAG SPEC RTRVL LRG 6X4 10 (ENDOMECHANICALS) ×1
CABLE HIGH FREQUENCY MONO STRZ (ELECTRODE) ×2 IMPLANT
CHLORAPREP W/TINT 26 (MISCELLANEOUS) ×2 IMPLANT
CLIP APPLIE ROT 10 11.4 M/L (STAPLE) ×1 IMPLANT
COVER MAYO STAND STRL (DRAPES) IMPLANT
COVER SURGICAL LIGHT HANDLE (MISCELLANEOUS) ×2 IMPLANT
COVER WAND RF STERILE (DRAPES) IMPLANT
DECANTER SPIKE VIAL GLASS SM (MISCELLANEOUS) ×2 IMPLANT
DERMABOND ADVANCED (GAUZE/BANDAGES/DRESSINGS) ×1
DERMABOND ADVANCED .7 DNX12 (GAUZE/BANDAGES/DRESSINGS) ×1 IMPLANT
DRAPE C-ARM 42X120 X-RAY (DRAPES) IMPLANT
ELECT REM PT RETURN 15FT ADLT (MISCELLANEOUS) ×2 IMPLANT
ENDOLOOP SUT PDS II  0 18 (SUTURE) ×2
ENDOLOOP SUT PDS II 0 18 (SUTURE) IMPLANT
GLOVE SURG ENC MOIS LTX SZ6 (GLOVE) ×2 IMPLANT
GLOVE SURG UNDER LTX SZ6.5 (GLOVE) ×2 IMPLANT
GOWN STRL REUS W/TWL LRG LVL3 (GOWN DISPOSABLE) ×2 IMPLANT
GOWN STRL REUS W/TWL XL LVL3 (GOWN DISPOSABLE) ×4 IMPLANT
GRASPER SUT TROCAR 14GX15 (MISCELLANEOUS) ×1 IMPLANT
HEMOSTAT SNOW SURGICEL 2X4 (HEMOSTASIS) IMPLANT
KIT BASIN OR (CUSTOM PROCEDURE TRAY) ×2 IMPLANT
KIT TURNOVER KIT A (KITS) ×2 IMPLANT
NDL INSUFFLATION 14GA 120MM (NEEDLE) ×1 IMPLANT
NEEDLE INSUFFLATION 14GA 120MM (NEEDLE) ×2 IMPLANT
PENCIL SMOKE EVACUATOR (MISCELLANEOUS) IMPLANT
POUCH SPECIMEN RETRIEVAL 10MM (ENDOMECHANICALS) ×2 IMPLANT
SCISSORS LAP 5X35 DISP (ENDOMECHANICALS) ×2 IMPLANT
SET CHOLANGIOGRAPH MIX (MISCELLANEOUS) IMPLANT
SET IRRIG TUBING LAPAROSCOPIC (IRRIGATION / IRRIGATOR) ×2 IMPLANT
SET TUBE SMOKE EVAC HIGH FLOW (TUBING) ×2 IMPLANT
SLEEVE XCEL OPT CAN 5 100 (ENDOMECHANICALS) ×4 IMPLANT
SUT MNCRL AB 4-0 PS2 18 (SUTURE) ×2 IMPLANT
TOWEL OR 17X26 10 PK STRL BLUE (TOWEL DISPOSABLE) ×2 IMPLANT
TOWEL OR NON WOVEN STRL DISP B (DISPOSABLE) IMPLANT
TRAY LAPAROSCOPIC (CUSTOM PROCEDURE TRAY) ×2 IMPLANT
TROCAR BLADELESS OPT 5 100 (ENDOMECHANICALS) ×2 IMPLANT
TROCAR XCEL 12X100 BLDLESS (ENDOMECHANICALS) ×2 IMPLANT

## 2020-05-24 NOTE — Anesthesia Postprocedure Evaluation (Signed)
Anesthesia Post Note  Patient: Carol Conway  Procedure(s) Performed: LAPAROSCOPIC CHOLECYSTECTOMY (N/A Abdomen)     Patient location during evaluation: Phase II Anesthesia Type: General Level of consciousness: awake and alert, patient cooperative and oriented Pain management: pain level controlled Vital Signs Assessment: post-procedure vital signs reviewed and stable Respiratory status: nonlabored ventilation, spontaneous breathing and respiratory function stable Cardiovascular status: blood pressure returned to baseline and stable Postop Assessment: no apparent nausea or vomiting, able to ambulate and adequate PO intake Anesthetic complications: no Comments: 0850 pt appeared discoordinated with swallowing and speaking, VSS, Additional Sugammadex given since pt appeared weak.  Immediate improvement.    No complications documented.  Last Vitals:  Vitals:   05/24/20 0958 05/24/20 1149  BP: 117/89 111/69  Pulse: 61 73  Resp: 14 16  Temp: 36.4 C (!) 36.4 C  SpO2: 94% 100%    Last Pain:  Vitals:   05/24/20 1149  TempSrc: Oral  PainSc:                  Nickolis Diel,E. Gio Janoski

## 2020-05-24 NOTE — OR Nursing (Signed)
Patient was having low oxygen saturations when resting in the low 80s called anesthesia and she said to watch her for a while.

## 2020-05-24 NOTE — OR Nursing (Signed)
Patient has been resting with eyes closed she has maintained her oxygen saturations for the past 20 minutes called husband went over discharge, patient up to restroom, and is now getting dressed.

## 2020-05-24 NOTE — Anesthesia Procedure Notes (Signed)
Procedure Name: Intubation Date/Time: 05/24/2020 8:36 AM Performed by: Lavina Hamman, CRNA Pre-anesthesia Checklist: Patient identified, Emergency Drugs available, Suction available, Patient being monitored and Timeout performed Patient Re-evaluated:Patient Re-evaluated prior to induction Oxygen Delivery Method: Circle system utilized Preoxygenation: Pre-oxygenation with 100% oxygen Induction Type: IV induction Ventilation: Mask ventilation without difficulty Laryngoscope Size: Mac and 3 Grade View: Grade I Tube type: Oral Tube size: 7.5 mm Number of attempts: 1 Airway Equipment and Method: Stylet Placement Confirmation: ETT inserted through vocal cords under direct vision,  positive ETCO2,  CO2 detector and breath sounds checked- equal and bilateral Secured at: 21 cm Tube secured with: Tape Dental Injury: Teeth and Oropharynx as per pre-operative assessment

## 2020-05-24 NOTE — Interval H&P Note (Signed)
History and Physical Interval Note:  05/24/2020 6:56 AM  Carol Conway  has presented today for surgery, with the diagnosis of BILIARY COLIC.  The various methods of treatment have been discussed with the patient and family. After consideration of risks, benefits and other options for treatment, the patient has consented to  Procedure(s) with comments: LAPAROSCOPIC CHOLECYSTECTOMY WITH INTRAOPERATIVE CHOLANGIOGRAM (N/A) - ROOM 5 STARTING AT 07:30AM FOR 120 MIN as a surgical intervention.  The patient's history has been reviewed, patient examined, no change in status, stable for surgery.  I have reviewed the patient's chart and labs.  Questions were answered to the patient's satisfaction.     Dandra Velardi Lollie Sails

## 2020-05-24 NOTE — Discharge Instructions (Signed)
General Anesthesia, Adult, Care After This sheet gives you information about how to care for yourself after your procedure. Your health care provider may also give you more specific instructions. If you have problems or questions, contact your health care provider. What can I expect after the procedure? After the procedure, the following side effects are common:  Pain or discomfort at the IV site.  Nausea.  Vomiting.  Sore throat.  Trouble concentrating.  Feeling cold or chills.  Feeling weak or tired.  Sleepiness and fatigue.  Soreness and body aches. These side effects can affect parts of the body that were not involved in surgery. Follow these instructions at home: For the time period you were told by your health care provider:  Rest.  Do not participate in activities where you could fall or become injured.  Do not drive or use machinery.  Do not drink alcohol.  Do not take sleeping pills or medicines that cause drowsiness.  Do not make important decisions or sign legal documents.  Do not take care of children on your own.   Eating and drinking  Follow any instructions from your health care provider about eating or drinking restrictions.  When you feel hungry, start by eating small amounts of foods that are soft and easy to digest (bland), such as toast. Gradually return to your regular diet.  Drink enough fluid to keep your urine pale yellow.  If you vomit, rehydrate by drinking water, juice, or clear broth. General instructions  If you have sleep apnea, surgery and certain medicines can increase your risk for breathing problems. Follow instructions from your health care provider about wearing your sleep device: ? Anytime you are sleeping, including during daytime naps. ? While taking prescription pain medicines, sleeping medicines, or medicines that make you drowsy.  Have a responsible adult stay with you for the time you are told. It is important to have  someone help care for you until you are awake and alert.  Return to your normal activities as told by your health care provider. Ask your health care provider what activities are safe for you.  Take over-the-counter and prescription medicines only as told by your health care provider.  If you smoke, do not smoke without supervision.  Keep all follow-up visits as told by your health care provider. This is important. Contact a health care provider if:  You have nausea or vomiting that does not get better with medicine.  You cannot eat or drink without vomiting.  You have pain that does not get better with medicine.  You are unable to pass urine.  You develop a skin rash.  You have a fever.  You have redness around your IV site that gets worse. Get help right away if:  You have difficulty breathing.  You have chest pain.  You have blood in your urine or stool, or you vomit blood. Summary  After the procedure, it is common to have a sore throat or nausea. It is also common to feel tired.  Have a responsible adult stay with you for the time you are told. It is important to have someone help care for you until you are awake and alert.  When you feel hungry, start by eating small amounts of foods that are soft and easy to digest (bland), such as toast. Gradually return to your regular diet.  Drink enough fluid to keep your urine pale yellow.  Return to your normal activities as told by your health care provider.   Ask your health care provider what activities are safe for you. This information is not intended to replace advice given to you by your health care provider. Make sure you discuss any questions you have with your health care provider. Document Revised: 11/19/2019 Document Reviewed: 06/18/2019 Elsevier Patient Education  2021 Elsevier Inc.  LAPAROSCOPIC SURGERY: POST OP INSTRUCTIONS   EAT Gradually transition to a high fiber diet with a fiber supplement over the  next few weeks after discharge.  Start with a pureed / full liquid diet (see below)  WALK Walk an hour a day.  Control your pain to do that.    CONTROL PAIN Control pain so that you can walk, sleep, tolerate sneezing/coughing, go up/down stairs.  HAVE A BOWEL MOVEMENT DAILY Keep your bowels regular to avoid problems.  OK to try a laxative to override constipation.  OK to use an antidairrheal to slow down diarrhea.  Call if not better after 2 tries  CALL IF YOU HAVE PROBLEMS/CONCERNS Call if you are still struggling despite following these instructions. Call if you have concerns not answered by these instructions    1. DIET: Follow a light bland diet & liquids the first 24 hours after arrival home, such as soup, liquids, starches, etc.  Be sure to drink plenty of fluids.  Quickly advance to a usual solid diet within a few days.  Avoid fast food or heavy meals as your are more likely to get nauseated or have irregular bowels.  A low-sugar, high-fiber diet for the rest of your life is ideal.  2. Take your usually prescribed home medications unless otherwise directed.  3. PAIN CONTROL: a. Pain is best controlled by a usual combination of three different methods TOGETHER: i. Ice/Heat ii. Over the counter pain medication iii. Prescription pain medication b. Most patients will experience some swelling and bruising around the incisions.  Ice packs or heating pads (30-60 minutes up to 6 times a day) will help. Use ice for the first few days to help decrease swelling and bruising, then switch to heat to help relax tight/sore spots and speed recovery.  Some people prefer to use ice alone, heat alone, alternating between ice & heat.  Experiment to what works for you.  Swelling and bruising can take several weeks to resolve.   c. It is helpful to take an over-the-counter pain medication regularly for the first few days: i. Naproxen (Aleve, etc)  Two 220mg  tabs twice a day OR Ibuprofen (Advil, etc)  Three 200mg  tabs four times a day (every meal & bedtime) AND ii. Acetaminophen (Tylenol, etc) 500-650mg  four times a day (every meal & bedtime) d. A  prescription for pain medication (such as oxycodone, hydrocodone, tramadol, gabapentin, methocarbamol, etc) should be given to you upon discharge.  Take your pain medication as prescribed, IF NEEDED.  i. If you are having problems/concerns with the prescription medicine (does not control pain, nausea, vomiting, rash, itching, etc), please call [phone] to see if we need to switch you to a different pain medicine that will work better for you and/or control your side effect better. ii. If you need a refill on your pain medication, please give [place] 48 hour notice.  contact your pharmacy.  They will contact our office to request authorization. Prescriptions will not be filled after 5 pm or on week-ends  4. Avoid getting constipated.   a. Between the surgery and the pain medications, it is common to experience some constipation.   b. Increasing fluid  intake and taking a fiber supplement (such as Metamucil, Citrucel, FiberCon, MiraLax, etc) 1-2 times a day regularly will usually help prevent this problem from occurring.   c. A mild laxative (prune juice, Milk of Magnesia, MiraLax, etc) should be taken according to package directions if there are no bowel movements after 48 hours.   5. Watch out for diarrhea.   a. If you have many loose bowel movements, simplify your diet to bland foods & liquids for a few days.   b. Stop any stool softeners and decrease your fiber supplement.   c. Switching to mild anti-diarrheal medications (Kayopectate, Pepto Bismol) can help.   d. If this worsens or does not improve, please call us.  6. Wash / shower every day.  You may shower over the skin glue which is waterproof  7. Glue will flake off after about 2 weeks.  You may leave the incision open to air.  You may replace a dressing/Band-Aid to cover the incision for  comfort if you wish.   8. ACTIVITIES as tolerated:   a. You may resume regular (light) daily activities beginning the next day--such as daily self-care, walking, climbing stairs--gradually increasing activities as tolerated.  If you can walk 30 minutes without difficulty, it is safe to try more intense activity such as jogging, treadmill, bicycling, low-impact aerobics, swimming, etc. b. Save the most intensive and strenuous activity for last such as sit-ups, heavy lifting, contact sports, etc  Refrain from any heavy lifting or straining until you are off narcotics for pain control.   c. DO NOT PUSH THROUGH PAIN.  Let pain be your guide: If it hurts to do something, don't do it.  Pain is your body warning you to avoid that activity for another week until the pain goes down. d. You may drive when you are no longer taking prescription pain medication, you can comfortably wear a seatbelt, and you can safely maneuver your car and apply brakes. e. Bonita Quin may have sexual intercourse when it is comfortable.  9. FOLLOW UP in our office a. Please call [facility] at [phone] to set up an appointment to see your surgeon in the office for a follow-up appointment approximately 2-3 weeks after your surgery. b. Make sure that you call for this appointment the day you arrive home to insure a convenient appointment time.  10. IF YOU HAVE DISABILITY OR FAMILY LEAVE FORMS, BRING THEM TO THE OFFICE FOR PROCESSING.  DO NOT GIVE THEM TO YOUR DOCTOR.   WHEN TO CALL us [phone]: 1. Poor pain control 2. Reactions / problems with new medications (rash/itching, nausea, etc)  3. Fever over 101.5 F (38.5 C) 4. Inability to urinate 5. Nausea and/or vomiting 6. Worsening swelling or bruising 7. Continued bleeding from incision. 8. Increased pain, redness, or drainage from the incision   The clinic staff is available to answer your questions during regular business hours (8:30am-5pm).  Please don't hesitate to call and  ask to speak to one of our nurses for clinical concerns.   If you have a medical emergency, go to the nearest emergency room or call 911.  A surgeon from [facility] is always on call at the [facility], [place] [address], [place], [place]  [address] ? MAIN: [phone] ? TOLL FREE: [phone] ?  FAX [phone] [email or web address]

## 2020-05-24 NOTE — Op Note (Signed)
Operative Note  Carol Conway 50 y.o. female [id number]  05/24/2020  Surgeon: Berna Bue MD FACS  Procedure performed: Laparoscopic Cholecystectomy  Preop diagnosis: biliary colic Post-op diagnosis/intraop findings: same  Specimens: gallbladder  Retained items: none  EBL: minimal  Complications: none  Description of procedure: After obtaining informed consent the patient was brought to the operating room. Antibiotics were administered. SCD's were applied. General endotracheal anesthesia was initiated and a formal time-out was performed. The abdomen was prepped and draped in the usual sterile fashion and the abdomen was entered using an infraumbilical veress needle after instilling the site with local. Insufflation to was obtained, 20mm trocar and camera inserted, and gross inspection revealed no evidence of injury from our entry or other intraabdominal abnormalities. Two 49mm trocars were introduced in the right midclavicular and right anterior axillary lines under direct visualization and following infiltration with local. An 33mm trocar was placed in the epigastrium. The gallbladder was retracted cephalad and the infundibulum was retracted laterally. A combination of hook electrocautery and blunt dissection was utilized to clear the peritoneum from the neck and cystic duct, circumferentially isolating the cystic artery and cystic duct and lifting the gallbladder from the cystic plate. The critical view of safety was achieved with the cystic artery, cystic duct, and liver bed visualized between them with no other structures. The artery was clipped with two clips proximally and one distally and divided as was the cystic duct with three clips on the proximal end. This was reinforced with a PDS endoloop. The gallbladder was dissected from the liver plate using electrocautery. Once freed the gallbladder was placed in an endocatch bag and removed intact through the epigastric trocar  site. The right upper quadrant was irrigated and aspirated; the effluent was clear. Hemostasis was once again confirmed, and reinspection of the abdomen revealed no injuries. The clips were well opposed without any bile leak from the duct or the liver bed. The 29mm trocar site in the epigastrium was closed with a 0 vicryl in the fascia under direct visualization using a PMI device. The abdomen was desufflated and all trocars removed. The skin incisions were closed with running subcuticular monocryl and Dermabond. The patient was awakened, extubated and transported to the recovery room in stable condition.    All counts were correct at the completion of the case.

## 2020-05-24 NOTE — Transfer of Care (Signed)
Immediate Anesthesia Transfer of Care Note  Patient: Carol Conway  Procedure(s) Performed: Procedure(s) with comments: LAPAROSCOPIC CHOLECYSTECTOMY (N/A) - ROOM 5 STARTING AT 07:30AM FOR 120 MIN  Patient Location: PACU  Anesthesia Type:General  Level of Consciousness:  sedated, patient cooperative and responds to stimulation  Airway & Oxygen Therapy:Patient Spontanous Breathing and Patient connected to face mask oxgen  Post-op Assessment:  Report given to PACU RN and Post -op Vital signs reviewed and stable  Post vital signs:  Reviewed and stable  Last Vitals:  Vitals:   05/24/20 0636  BP: 130/87  Pulse: 80  Resp: 16  Temp: 36.9 C  SpO2: 100%    Complications: No apparent anesthesia complications

## 2020-05-25 ENCOUNTER — Encounter (HOSPITAL_COMMUNITY): Payer: Self-pay | Admitting: Surgery

## 2020-05-25 LAB — SURGICAL PATHOLOGY

## 2020-06-20 ENCOUNTER — Encounter: Payer: Self-pay | Admitting: Cardiology

## 2020-06-20 ENCOUNTER — Other Ambulatory Visit: Payer: Self-pay

## 2020-06-20 ENCOUNTER — Encounter: Payer: Self-pay | Admitting: Radiology

## 2020-06-20 ENCOUNTER — Telehealth: Payer: Self-pay | Admitting: *Deleted

## 2020-06-20 ENCOUNTER — Ambulatory Visit (INDEPENDENT_AMBULATORY_CARE_PROVIDER_SITE_OTHER): Payer: BC Managed Care – PPO | Admitting: Cardiology

## 2020-06-20 VITALS — BP 114/62 | HR 92 | Ht 61.25 in | Wt 161.4 lb

## 2020-06-20 DIAGNOSIS — R002 Palpitations: Secondary | ICD-10-CM | POA: Diagnosis not present

## 2020-06-20 DIAGNOSIS — Z8249 Family history of ischemic heart disease and other diseases of the circulatory system: Secondary | ICD-10-CM | POA: Diagnosis not present

## 2020-06-20 DIAGNOSIS — G4719 Other hypersomnia: Secondary | ICD-10-CM

## 2020-06-20 DIAGNOSIS — R0683 Snoring: Secondary | ICD-10-CM

## 2020-06-20 NOTE — Progress Notes (Signed)
Enrolled patient for a 30 day [employer] Event monitor to be mailed to patients home.  

## 2020-06-20 NOTE — Progress Notes (Signed)
Cardiology Consult  Note    Date:  06/20/2020   ID:  Carol Conway, DOB [birth year], MRN [id number]  PCP:  Clarnce Flock, RN (Inactive)  Cardiologist:  Armanda Magic, MD   Chief Complaint  Patient presents with  . New Patient (Initial Visit)    Family history of premature CAD    History of Present Illness:  Carol Conway is a 50 y.o. female who is being seen today for the evaluation of family history of CAD at the request of Rivard, Dois Davenport, MD.  This is a 50yo female with no prior cardiac disease but a fm history of CAD in her father who is referred for evaluation of future cardiac risks. She had been having problems with epigastric pain into her back and was dx with cholelithiasis and underwent lap chol 05/24/2020 and all her symptoms resolved.  She denies any exertional chest pain or pressure, SOB, DOE, PND, orthopnea, LE edema, dizziness or syncope. She does admit to intermittent palpitations at times that are very sporadic and usually last a few minutes and feels like a fluttering.  They occur maybe 1-2 times monthly.  She drinks 2.5 cups a coffee every am but no other caffeine.  She is compliant with her meds and is tolerating meds with no SE.    Her father was dx with CAD in his 66s and had CABG and has an AICD.  Her paternal grandmother had CAD and CABG in her 44's.  She has a fm hx of CVA on her mom's side of the family.    She also tells me that she has severe snoring and occasionally wakes herself up snoring.  She feels fatigued in the am and does not sleep well at night.  She does not had to nap during the day.   Past Medical History:  Diagnosis Date  . Abnormal pap   . PONV (postoperative nausea and vomiting)     Past Surgical History:  Procedure Laterality Date  . BREAST BIOPSY Left 05/2017  . BREAST CYST ASPIRATION Left 03/2015  . CHOLECYSTECTOMY N/A 05/24/2020   Procedure: LAPAROSCOPIC CHOLECYSTECTOMY;  Surgeon: Berna Bue, MD;  Location: [facility];   Service: General;  Laterality: N/A;  ROOM 5 STARTING AT 07:30AM FOR 120 MIN  . LEEP    . PERINEOPLASTY    . WISDOM TOOTH EXTRACTION      Current Medications: No outpatient medications have been marked as taking for the 06/20/20 encounter (Office Visit) with [name], MD.    Allergies:   Patient has no known allergies.   Social History   Socioeconomic History  . Marital status: Married    Spouse name: Not on file  . Number of children: Not on file  . Years of education: Not on file  . Highest education level: Not on file  Occupational History  . Not on file  Tobacco Use  . Smoking status: Former Games developer  . Smokeless tobacco: Never Used  . Tobacco comment: Quit 16 years ago  Vaping Use  . Vaping Use: Never used  Substance and Sexual Activity  . Alcohol use: No  . Drug use: No  . Sexual activity: Yes    Birth control/protection: I.U.D.  Other Topics Concern  . Not on file  Social History Narrative  . Not on file   Social Determinants of Health   Financial Resource Strain: Not on file  Food Insecurity: Not on file  Transportation Needs: Not on file  Physical  Activity: Not on file  Stress: Not on file  Social Connections: Not on file     Family History:  The patient's family history includes Breast cancer in her paternal grandmother; Heart disease in her father; Ovarian cancer in her maternal grandmother and paternal grandmother; Parkinsonism in her paternal grandmother.   ROS:   Please see the history of present illness.    ROS All other systems reviewed and are negative.  No flowsheet data found.     PHYSICAL EXAM:   VS:  BP 114/62   Pulse 92   Ht 5' 1.25" (1.556 m)   Wt 161 lb 6.4 oz (73.2 kg)   SpO2 99%   BMI 30.25 kg/m    GEN: Well nourished, well developed, in no acute distress  HEENT: normal  Neck: no JVD, carotid bruits, or masses Cardiac: RRR; no murmurs, rubs, or gallops,no edema.  Intact distal pulses bilaterally.  Respiratory:  clear  to auscultation bilaterally, normal work of breathing GI: soft, nontender, nondistended, + BS MS: no deformity or atrophy  Skin: warm and dry, no rash Neuro:  Alert and Oriented x 3, Strength and sensation are intact Psych: euthymic mood, full affect  Wt Readings from Last 3 Encounters:  06/20/20 161 lb 6.4 oz (73.2 kg)  05/24/20 164 lb 3.2 oz (74.5 kg)  05/17/20 164 lb 3.2 oz (74.5 kg)      Studies/Labs Reviewed:   EKG:  EKG is ordered today.  The ekg ordered today demonstrates NSR with  No ST changes  Recent Labs: 05/17/2020: Hemoglobin 11.9; Platelets 233   Lipid Panel No results found for: CHOL, TRIG, HDL, CHOLHDL, VLDL, LDLCALC, LDLDIRECT   Additional studies/ records that were reviewed today include:  OV notes from PCP    ASSESSMENT:    1. Family history of premature CAD   2. Excessive daytime sleepiness   3. Snoring   4. Palpitations      PLAN:  In order of problems listed above:  1.  Family history of premature CAD -she is completely asymptomatic -she is a former smoker and that along with fm hx of CAD are her only CRFs. -her EKG is non ischemic -I will get a coronary Ca score to assess future risk  2.  Excessive daytime sleepiness/Snoring -I will get a PSG to rule out OSA  3.  Palpitations -these are infrequent and do not last long but she has a fm hx of PAF and also may have OSA by sx -I will get a 30 day event monitor   Medication Adjustments/Labs and Tests Ordered: Current medicines are reviewed at length with the patient today.  Concerns regarding medicines are outlined above.  Medication changes, Labs and Tests ordered today are listed in the Patient Instructions below.  There are no Patient Instructions on file for this visit.   Signed, Armanda Magic, MD  06/20/2020 2:07 PM    [facility] [address] [place], [place], [place]  [address] Phone: [phone]; Fax: [phone]

## 2020-06-20 NOTE — Patient Instructions (Signed)
Medication Instructions:  Your physician recommends that you continue on your current medications as directed. Please refer to the Current Medication list given to you today.  *If you need a refill on your cardiac medications before your next appointment, please call your pharmacy*  Testing/Procedures: Your physician has recommended that you wear an event monitor. Event monitors are medical devices that record the heart's electrical activity. Doctors most often [place] these monitors to diagnose arrhythmias. Arrhythmias are problems with the speed or rhythm of the heartbeat. The monitor is a small, portable device. You can wear one while you do your normal daily activities. This is usually used to diagnose what is causing palpitations/syncope (passing out).  Your physician has recommended that you have a calcium score CT scan.   Your physician has recommended that you have a sleep study. This test records several body functions during sleep, including: brain activity, eye movement, oxygen and carbon dioxide blood levels, heart rate and rhythm, breathing rate and rhythm, the flow of air through your mouth and nose, snoring, body muscle movements, and chest and belly movement.   Follow-Up: At [facility], you and your health needs are our priority.  As part of our continuing mission to provide you with exceptional heart care, we have created designated Provider Care Teams.  These Care Teams include your primary Cardiologist (physician) and Advanced Practice Providers (APPs -  Physician Assistants and Nurse Practitioners) who all work together to provide you with the care you need, when you need it.  Follow up with Dr. Mayford Knife as needed based on results of testing.

## 2020-06-20 NOTE — Telephone Encounter (Signed)
PA for in lab sleep study submitted to [facility] via web portal.

## 2020-06-20 NOTE — Telephone Encounter (Signed)
-----   Message from [name], RN sent at 06/20/2020  2:32 PM EDT ----- In-lab PSG study has been ordered.  Thanks! Carly

## 2020-06-24 ENCOUNTER — Ambulatory Visit (INDEPENDENT_AMBULATORY_CARE_PROVIDER_SITE_OTHER): Payer: BC Managed Care – PPO

## 2020-06-24 DIAGNOSIS — R002 Palpitations: Secondary | ICD-10-CM

## 2020-06-29 NOTE — Telephone Encounter (Signed)
received CPAP titration denial letter from [employer]). Copy of letter givenover to TT to review.

## 2020-07-08 ENCOUNTER — Telehealth: Payer: Self-pay | Admitting: *Deleted

## 2020-07-08 NOTE — Telephone Encounter (Signed)
Staff message sent to [facility] ok to schedule HST. [employer] auth received. Order # [id number]. Valid dates 07/08/20 to 01/04/21.

## 2020-07-08 NOTE — Addendum Note (Signed)
Addended by: Reesa Chew on: 07/08/2020 09:58 AM   Modules accepted: Orders

## 2020-07-19 NOTE — Telephone Encounter (Signed)
Informed patient of upcoming home sleep study and patient understanding was verbalized.  Patient understands her/his HST is scheduled for 08-19-20 at 12. Pt is aware of testing date.

## 2020-07-26 ENCOUNTER — Encounter: Payer: Self-pay | Admitting: Cardiology

## 2020-07-26 ENCOUNTER — Telehealth: Payer: Self-pay

## 2020-07-26 DIAGNOSIS — R002 Palpitations: Secondary | ICD-10-CM

## 2020-07-26 DIAGNOSIS — I471 Supraventricular tachycardia: Secondary | ICD-10-CM | POA: Insufficient documentation

## 2020-07-26 NOTE — Telephone Encounter (Signed)
The patient has been notified of the result and verbalized understanding.  All questions (if any) were answered. [name], RN 07/26/2020 5:30 PM  Patient does not feel like she needs to start on Toprol. She states palpitations are rare and not very bothersome. She will stop by the lab on Friday 5/13 when she comes for her CT scan.

## 2020-07-26 NOTE — Telephone Encounter (Signed)
-----   Message from [name], MD sent at 07/26/2020  5:23 PM EDT ----- Heart monitor showed normal rhythm with extra heart beats from the top of the heart up to 15 beats in a row.  These are benign.  I am happy to start her on Toprol to suppress these if she would like.  Please have her come in for BMET, Mag and TSH

## 2020-07-29 ENCOUNTER — Ambulatory Visit (INDEPENDENT_AMBULATORY_CARE_PROVIDER_SITE_OTHER)
Admission: RE | Admit: 2020-07-29 | Discharge: 2020-07-29 | Disposition: A | Discharge status: Home / Self Care | Payer: Self-pay | Source: Ambulatory Visit | Attending: Cardiology | Admitting: Cardiology

## 2020-07-29 ENCOUNTER — Other Ambulatory Visit: Payer: BC Managed Care – PPO | Admitting: *Deleted

## 2020-07-29 ENCOUNTER — Other Ambulatory Visit: Payer: Self-pay

## 2020-07-29 DIAGNOSIS — Z8249 Family history of ischemic heart disease and other diseases of the circulatory system: Secondary | ICD-10-CM

## 2020-07-29 DIAGNOSIS — R002 Palpitations: Secondary | ICD-10-CM | POA: Diagnosis not present

## 2020-08-01 LAB — BASIC METABOLIC PANEL
BUN/Creatinine Ratio: 13 (ref 9–23)
BUN: 12 mg/dL (ref 6–24)
CO2: 20 mmol/L (ref 20–29)
Calcium: 9.4 mg/dL (ref 8.7–10.2)
Chloride: 102 mmol/L (ref 96–106)
Creatinine, Ser: 0.92 mg/dL (ref 0.57–1.00)
Glucose: 86 mg/dL (ref 65–99)
Potassium: 4.2 mmol/L (ref 3.5–5.2)
Sodium: 139 mmol/L (ref 134–144)
eGFR: 77 mL/min/{1.73_m2} (ref >59)

## 2020-08-01 LAB — MAGNESIUM: Magnesium: 2.1 mg/dL (ref 1.6–2.3)

## 2020-08-01 LAB — TSH: TSH: 1.77 u[IU]/mL (ref 0.450–4.500)

## 2020-08-10 DIAGNOSIS — K635 Polyp of colon: Secondary | ICD-10-CM | POA: Diagnosis not present

## 2020-08-10 DIAGNOSIS — D122 Benign neoplasm of ascending colon: Secondary | ICD-10-CM | POA: Diagnosis not present

## 2020-08-10 DIAGNOSIS — Z1211 Encounter for screening for malignant neoplasm of colon: Secondary | ICD-10-CM | POA: Diagnosis not present

## 2020-08-10 DIAGNOSIS — D125 Benign neoplasm of sigmoid colon: Secondary | ICD-10-CM | POA: Diagnosis not present

## 2020-08-19 ENCOUNTER — Ambulatory Visit (HOSPITAL_BASED_OUTPATIENT_CLINIC_OR_DEPARTMENT_OTHER): Payer: BC Managed Care – PPO | Attending: Cardiology | Admitting: Cardiology

## 2020-08-19 ENCOUNTER — Other Ambulatory Visit: Payer: Self-pay

## 2020-08-19 DIAGNOSIS — G4719 Other hypersomnia: Secondary | ICD-10-CM

## 2020-08-25 NOTE — Procedures (Signed)
    Patient Name: Carol Conway, Carol Conway Date: 08/19/2020 Gender: Female D.O.B: November 05, 1970 Age (years): 39 Referring Provider: Armanda Magic MD, ABSM Height (inches): 61 Interpreting Physician: Armanda Magic MD, ABSM Weight (lbs): 155 RPSGT: [place] BMI: 29 MRN: [id number] Neck Size: 13.00  CLINICAL INFORMATION Sleep Study Type: HST  Indication for sleep study: Excessive Daytime Sleepiness  Epworth Sleepiness Score: 2  SLEEP STUDY TECHNIQUE A multi-channel overnight portable sleep study was performed. The channels recorded were: nasal airflow, thoracic respiratory movement, and oxygen saturation with a pulse oximetry. Snoring was also monitored.  MEDICATIONS Patient self administered medications include: N/A.  SLEEP ARCHITECTURE Patient was studied for 437.2 minutes. The sleep efficiency was 100.0 % and the patient was supine for 0%. The arousal index was 0.0 per hour.  RESPIRATORY PARAMETERS The overall AHI was 2.5 per hour, with a central apnea index of 0.1 per hour.  The oxygen nadir was 83% during sleep.  CARDIAC DATA Mean heart rate during sleep was 73.2 bpm.  IMPRESSIONS - No significant obstructive sleep apnea occurred during this study (AHI = 2.5/h). - No significant central sleep apnea occurred during this study (CAI = 0.1/h). - Moderate oxygen desaturation was noted during this study (Min O2 = 83%). - Patient snored 0.8% during the sleep.  DIAGNOSIS - Normal study  RECOMMENDATIONS - Recommend repeat with in lab PSG given negative results of home sleep study.  - Avoid alcohol, sedatives and other CNS depressants that may worsen sleep apnea and disrupt normal sleep architecture. - Sleep hygiene should be reviewed to assess factors that may improve sleep quality. - Weight management and regular exercise should be initiated or continued.  [Electronically signed] 08/25/2020 12:06 PM  Armanda Magic MD, ABSM Diplomate, American Board of Sleep  Medicine

## 2020-08-26 DIAGNOSIS — E559 Vitamin D deficiency, unspecified: Secondary | ICD-10-CM | POA: Diagnosis not present

## 2020-08-26 DIAGNOSIS — Z1331 Encounter for screening for depression: Secondary | ICD-10-CM | POA: Diagnosis not present

## 2020-09-05 NOTE — Telephone Encounter (Signed)
Normal home sleep study so in lab PSG will be ordered.

## 2020-09-05 NOTE — Telephone Encounter (Signed)
Informed patient of sleep study results and patient understanding was verbalized.  PSG sent to sleep pool

## 2020-09-08 NOTE — Addendum Note (Signed)
Addended by: Reesa Chew on: 09/08/2020 10:01 AM   Modules accepted: Orders

## 2020-09-08 NOTE — Telephone Encounter (Signed)
Prior Authorization for NPSG sent to [facility] via Phone. AUTH # [id number] valid dates 6-23-8-21-22 Patient was given appt on 8/16 she could not make that and ask for a sept date.

## 2020-09-13 DIAGNOSIS — N6019 Diffuse cystic mastopathy of unspecified breast: Secondary | ICD-10-CM | POA: Diagnosis not present

## 2020-09-13 DIAGNOSIS — E559 Vitamin D deficiency, unspecified: Secondary | ICD-10-CM | POA: Diagnosis not present

## 2020-09-14 NOTE — Telephone Encounter (Signed)
Per [place], at [facility] they give 60 days to schedule the sleep study. If the date exceeds that time frame you let the date run out and call back and resubmit a new request all over in 3 weeks.

## 2020-10-20 NOTE — Telephone Encounter (Signed)
Patient is scheduled for lab study on 10/21/20. Patient understands her sleep study will be done at [facility] sleep lab. Patient understands she will receive a sleep packet in a week or so. Patient understands to call if she does not receive the sleep packet in a timely manner. Patient agrees with treatment and thanked me for call Left detailed message on voicemail with date and time of titration and informed patient to call back to confirm or reschedule.

## 2020-10-21 ENCOUNTER — Other Ambulatory Visit: Payer: Self-pay

## 2020-10-21 ENCOUNTER — Ambulatory Visit (HOSPITAL_BASED_OUTPATIENT_CLINIC_OR_DEPARTMENT_OTHER): Payer: BC Managed Care – PPO | Attending: Cardiology | Admitting: Cardiology

## 2020-10-21 DIAGNOSIS — G4719 Other hypersomnia: Secondary | ICD-10-CM

## 2020-10-21 DIAGNOSIS — R0683 Snoring: Secondary | ICD-10-CM | POA: Diagnosis not present

## 2020-10-26 NOTE — Procedures (Signed)
     Patient Name: Carol Conway, Carol Conway Study Date:10/21/2020 Gender: Female D.O.B: 10/16/1970 Age (years): 50 Referring Provider: Theus Espin MD, ABSM Height (inches): 61 Interpreting Physician: Antonio Creswell MD, ABSM Weight (lbs): 155 RPSGT: [place], [place] BMI: 29 MRN: [id number] Neck Size: 13.00  CLINICAL INFORMATION Sleep Study Type: NPSG  Indication for sleep study: Daytime Fatigue, Snoring  Epworth Sleepiness Score: 5  SLEEP STUDY TECHNIQUE As per the AASM Manual for the Scoring of Sleep and Associated Events v2.3 (April 2016) with a hypopnea requiring 4% desaturations.  The channels recorded and monitored were frontal, central and occipital EEG, electrooculogram (EOG), submentalis EMG (chin), nasal and oral airflow, thoracic and abdominal wall motion, anterior tibialis EMG, snore microphone, electrocardiogram, and pulse oximetry.  MEDICATIONS Medications self-administered by patient taken the night of the study : N/A  SLEEP ARCHITECTURE The study was initiated at 10:24:38 PM and ended at 4:56:55 AM.  Sleep onset time was 49.6 minutes and the sleep efficiency was 78.8%. The total sleep time was 309 minutes.  Stage REM latency was 102.0 minutes.  The patient spent 3.4% of the night in stage N1 sleep, 72.2% in stage N2 sleep, 0.8% in stage N3 and 23.6% in REM.  Alpha intrusion was absent.  Supine sleep was 0.00%.  RESPIRATORY PARAMETERS The overall apnea/hypopnea index (AHI) was 0.2 per hour. There were 1 total apneas, including 1 obstructive, 0 central and 0 mixed apneas. There were 0 hypopneas and 0 RERAs.  The AHI during Stage REM sleep was 0.0 per hour.  AHI while supine was N/A per hour.  The mean oxygen saturation was 96.4%. The minimum SpO2 during sleep was 94.0%.  moderate snoring was noted during this study.  CARDIAC DATA The 2 lead EKG demonstrated sinus rhythm. The mean heart rate was 67.4 beats per minute. Other EKG findings include: None.  LEG MOVEMENT  DATA The total PLMS were 0 with a resulting PLMS index of 0.0. Associated arousal with leg movement index was 2.5 .  IMPRESSIONS - No significant obstructive sleep apnea occurred during this study (AHI = 0.2/h). - The patient had minimal or no oxygen desaturation during the study (Min O2 = 94.0%) - The patient snored with moderate snoring volume. - No cardiac abnormalities were noted during this study. - Clinically significant periodic limb movements did not occur during sleep. No significant associated arousals.  DIAGNOSIS - Normal Study  RECOMMENDATIONS - Avoid alcohol, sedatives and other CNS depressants that may worsen sleep apnea and disrupt normal sleep architecture. - Sleep hygiene should be reviewed to assess factors that may improve sleep quality. - Weight management and regular exercise should be initiated or continued if appropriate.  [Electronically signed] 10/26/2020 11:15 PM  Rhyleigh Grassel MD, ABSM Diplomate, American Board of Sleep Medicine   NPI: [id number] 

## 2020-10-26 NOTE — Progress Notes (Signed)
  Patient Name: San, Rua Date:10/21/2020 Gender: Female D.O.B: 02-06-1971 Age (years): 58 Referring Provider: Armanda Magic MD, ABSM Height (inches): 61 Interpreting Physician: Armanda Magic MD, ABSM Weight (lbs): 155 RPSGT: [place]ry Ram BMI: 29 MRN: [id number] Neck Size: 13.00  CLINICAL INFORMATION Sleep Study Type: NPSG  Indication for sleep study: Daytime Fatigue, Snoring  Epworth Sleepiness Score: 5  SLEEP STUDY TECHNIQUE As per the AASM Manual for the Scoring of Sleep and Associated Events v2.3 (April 2016) with a hypopnea requiring 4% desaturations.  The channels recorded and monitored were frontal, central and occipital EEG, electrooculogram (EOG), submentalis EMG (chin), nasal and oral airflow, thoracic and abdominal wall motion, anterior tibialis EMG, snore microphone, electrocardiogram, and pulse oximetry.  MEDICATIONS Medications self-administered by patient taken the night of the study : N/A  SLEEP ARCHITECTURE The study was initiated at 10:24:38 PM and ended at 4:56:55 AM.  Sleep onset time was 49.6 minutes and the sleep efficiency was 78.8%. The total sleep time was 309 minutes.  Stage REM latency was 102.0 minutes.  The patient spent 3.4% of the night in stage N1 sleep, 72.2% in stage N2 sleep, 0.8% in stage N3 and 23.6% in REM.  Alpha intrusion was absent.  Supine sleep was 0.00%.  RESPIRATORY PARAMETERS The overall apnea/hypopnea index (AHI) was 0.2 per hour. There were 1 total apneas, including 1 obstructive, 0 central and 0 mixed apneas. There were 0 hypopneas and 0 RERAs.  The AHI during Stage REM sleep was 0.0 per hour.  AHI while supine was N/A per hour.  The mean oxygen saturation was 96.4%. The minimum SpO2 during sleep was 94.0%.  moderate snoring was noted during this study.  CARDIAC DATA The 2 lead EKG demonstrated sinus rhythm. The mean heart rate was 67.4 beats per minute. Other EKG findings include: None.  LEG MOVEMENT  DATA The total PLMS were 0 with a resulting PLMS index of 0.0. Associated arousal with leg movement index was 2.5 .  IMPRESSIONS - No significant obstructive sleep apnea occurred during this study (AHI = 0.2/h). - The patient had minimal or no oxygen desaturation during the study (Min O2 = 94.0%) - The patient snored with moderate snoring volume. - No cardiac abnormalities were noted during this study. - Clinically significant periodic limb movements did not occur during sleep. No significant associated arousals.  DIAGNOSIS - Normal Study  RECOMMENDATIONS - Avoid alcohol, sedatives and other CNS depressants that may worsen sleep apnea and disrupt normal sleep architecture. - Sleep hygiene should be reviewed to assess factors that may improve sleep quality. - Weight management and regular exercise should be initiated or continued if appropriate.  [Electronically signed] 10/26/2020 11:15 PM  Armanda Magic MD, ABSM Diplomate, American Board of Sleep Medicine   NPI: [id number]

## 2020-11-01 ENCOUNTER — Encounter (HOSPITAL_BASED_OUTPATIENT_CLINIC_OR_DEPARTMENT_OTHER): Payer: BC Managed Care – PPO | Admitting: Cardiology

## 2020-11-03 ENCOUNTER — Other Ambulatory Visit: Payer: Self-pay | Admitting: Obstetrics and Gynecology

## 2020-11-03 DIAGNOSIS — N6001 Solitary cyst of right breast: Secondary | ICD-10-CM | POA: Diagnosis not present

## 2020-11-03 DIAGNOSIS — N6002 Solitary cyst of left breast: Secondary | ICD-10-CM | POA: Diagnosis not present

## 2020-11-16 ENCOUNTER — Telehealth: Payer: Self-pay | Admitting: *Deleted

## 2020-11-16 NOTE — Telephone Encounter (Signed)
The patient has been notified of the result and verbalized understanding.  All questions (if any) were answered. Latrelle Dodrill, CMA 11/16/2020 10:58 AM   Pt is aware and agreeable to normal results.

## 2020-11-16 NOTE — Telephone Encounter (Signed)
-----   Message from [name], MD sent at 10/26/2020 11:22 PM EDT ----- Please let patient know that sleep study showed no significant sleep apnea.

## 2020-11-23 ENCOUNTER — Encounter (HOSPITAL_BASED_OUTPATIENT_CLINIC_OR_DEPARTMENT_OTHER): Payer: BC Managed Care – PPO | Admitting: Cardiology

## 2020-12-20 DIAGNOSIS — H5213 Myopia, bilateral: Secondary | ICD-10-CM | POA: Diagnosis not present

## 2020-12-28 ENCOUNTER — Other Ambulatory Visit: Payer: Self-pay | Admitting: Obstetrics and Gynecology

## 2020-12-28 DIAGNOSIS — N6001 Solitary cyst of right breast: Secondary | ICD-10-CM | POA: Diagnosis not present

## 2020-12-28 DIAGNOSIS — R82998 Other abnormal findings in urine: Secondary | ICD-10-CM | POA: Diagnosis not present

## 2020-12-28 DIAGNOSIS — N6012 Diffuse cystic mastopathy of left breast: Secondary | ICD-10-CM | POA: Diagnosis not present

## 2020-12-28 DIAGNOSIS — N6011 Diffuse cystic mastopathy of right breast: Secondary | ICD-10-CM | POA: Diagnosis not present

## 2021-02-22 DIAGNOSIS — K635 Polyp of colon: Secondary | ICD-10-CM | POA: Diagnosis not present

## 2021-02-22 DIAGNOSIS — Z86010 Personal history of colonic polyps: Secondary | ICD-10-CM | POA: Diagnosis not present

## 2021-02-22 DIAGNOSIS — Z1211 Encounter for screening for malignant neoplasm of colon: Secondary | ICD-10-CM | POA: Diagnosis not present

## 2021-02-22 DIAGNOSIS — D122 Benign neoplasm of ascending colon: Secondary | ICD-10-CM | POA: Diagnosis not present

## 2021-04-24 DIAGNOSIS — Z3040 Encounter for surveillance of contraceptives, unspecified: Secondary | ICD-10-CM | POA: Diagnosis not present

## 2021-04-24 DIAGNOSIS — Z01419 Encounter for gynecological examination (general) (routine) without abnormal findings: Secondary | ICD-10-CM | POA: Diagnosis not present

## 2021-04-24 DIAGNOSIS — Z1231 Encounter for screening mammogram for malignant neoplasm of breast: Secondary | ICD-10-CM | POA: Diagnosis not present

## 2021-04-24 DIAGNOSIS — Z1211 Encounter for screening for malignant neoplasm of colon: Secondary | ICD-10-CM | POA: Diagnosis not present

## 2021-09-05 DIAGNOSIS — Z0000 Encounter for general adult medical examination without abnormal findings: Secondary | ICD-10-CM | POA: Diagnosis not present

## 2021-09-05 DIAGNOSIS — E559 Vitamin D deficiency, unspecified: Secondary | ICD-10-CM | POA: Diagnosis not present

## 2021-09-12 DIAGNOSIS — Z1339 Encounter for screening examination for other mental health and behavioral disorders: Secondary | ICD-10-CM | POA: Diagnosis not present

## 2021-09-12 DIAGNOSIS — Z0000 Encounter for general adult medical examination without abnormal findings: Secondary | ICD-10-CM | POA: Diagnosis not present

## 2021-09-12 DIAGNOSIS — Z1331 Encounter for screening for depression: Secondary | ICD-10-CM | POA: Diagnosis not present

## 2022-04-25 DIAGNOSIS — N939 Abnormal uterine and vaginal bleeding, unspecified: Secondary | ICD-10-CM | POA: Diagnosis not present

## 2022-04-25 DIAGNOSIS — Z01419 Encounter for gynecological examination (general) (routine) without abnormal findings: Secondary | ICD-10-CM | POA: Diagnosis not present

## 2022-04-25 DIAGNOSIS — Z1231 Encounter for screening mammogram for malignant neoplasm of breast: Secondary | ICD-10-CM | POA: Diagnosis not present

## 2022-04-25 DIAGNOSIS — E28319 Asymptomatic premature menopause: Secondary | ICD-10-CM | POA: Diagnosis not present

## 2022-09-07 DIAGNOSIS — N939 Abnormal uterine and vaginal bleeding, unspecified: Secondary | ICD-10-CM | POA: Diagnosis not present

## 2022-09-14 DIAGNOSIS — E559 Vitamin D deficiency, unspecified: Secondary | ICD-10-CM | POA: Diagnosis not present

## 2022-09-14 DIAGNOSIS — Z8249 Family history of ischemic heart disease and other diseases of the circulatory system: Secondary | ICD-10-CM | POA: Diagnosis not present

## 2022-09-14 DIAGNOSIS — R7989 Other specified abnormal findings of blood chemistry: Secondary | ICD-10-CM | POA: Diagnosis not present

## 2022-09-14 DIAGNOSIS — Z0000 Encounter for general adult medical examination without abnormal findings: Secondary | ICD-10-CM | POA: Diagnosis not present

## 2022-09-14 DIAGNOSIS — E785 Hyperlipidemia, unspecified: Secondary | ICD-10-CM | POA: Diagnosis not present

## 2022-09-27 DIAGNOSIS — Z1331 Encounter for screening for depression: Secondary | ICD-10-CM | POA: Diagnosis not present

## 2022-09-27 DIAGNOSIS — Z0000 Encounter for general adult medical examination without abnormal findings: Secondary | ICD-10-CM | POA: Diagnosis not present

## 2022-09-27 DIAGNOSIS — Z1339 Encounter for screening examination for other mental health and behavioral disorders: Secondary | ICD-10-CM | POA: Diagnosis not present

## 2023-03-27 ENCOUNTER — Other Ambulatory Visit: Payer: Self-pay | Admitting: Obstetrics and Gynecology

## 2023-03-27 DIAGNOSIS — Z1231 Encounter for screening mammogram for malignant neoplasm of breast: Secondary | ICD-10-CM

## 2023-03-30 IMAGING — CT CT CARDIAC CORONARY ARTERY CALCIUM SCORE
3 series · 14 of 20 positions shown, 15 images · non-contrast
Comparison: None.
COMPARISON: None.

Addendum:
EXAM:
OVER-READ INTERPRETATION  CT CHEST

The following report is an over-read performed by radiologist Dr.
Barbaro Adele [REDACTED] on 07/29/2020. This over-read
does not include interpretation of cardiac or coronary anatomy or
pathology. The calcium score interpretation by the cardiologist is
attached.
CLINICAL DATA: Cardiovascular Disease Risk stratification
Coronary Calcium Score
TECHNIQUE: A gated, non-contrast computed tomography scan of the heart was
performed using 3mm slice thickness. Axial images were analyzed on a
dedicated workstation. Calcium scoring of the coronary arteries was
performed using the Agatston method.

[Series 2: casc 3.0 bv41 2 bestdiast 73 % · axial · 0.39mm/px · z∈[-210,-150]mm · 4 of 34 slices shown, 5 images]
[im 7/34  vessel]
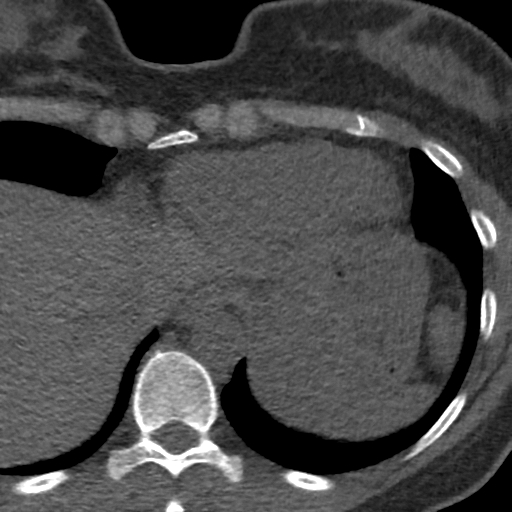
[im 7/34  lung]
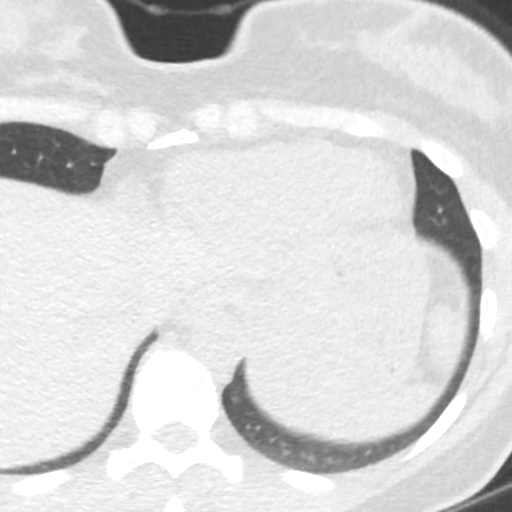
[im 14/34  vessel]
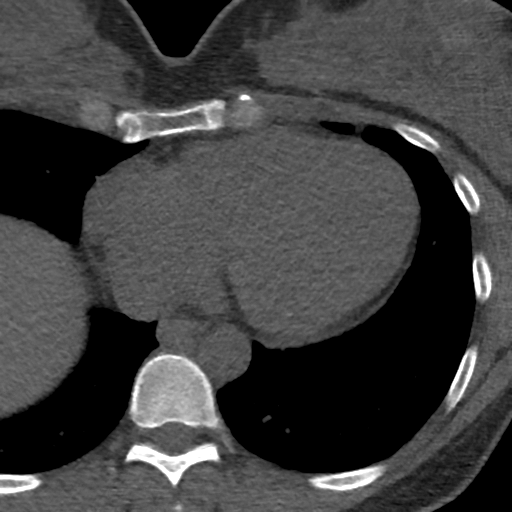
[im 20/34  vessel]
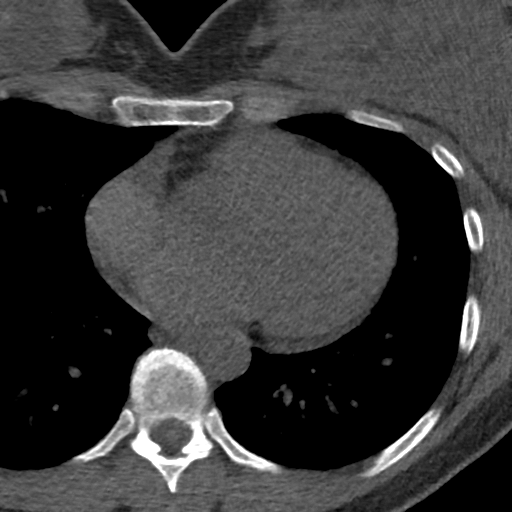
[im 27/34  vessel]
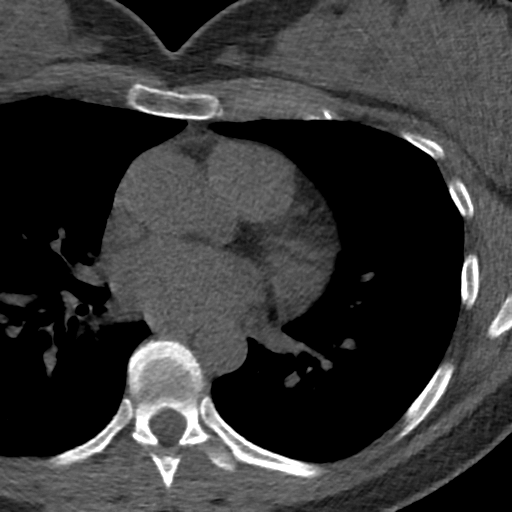

[Series 3: lung 71 % · axial · 0.67mm/px · z∈[-214,-148]mm · 5 of 34 slices shown]
[im 6/34  lung]
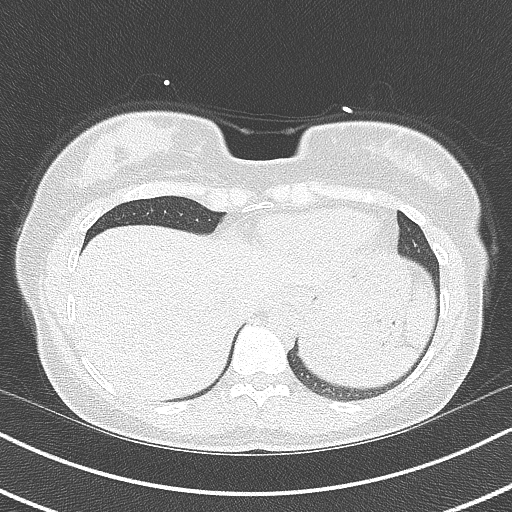
[im 12/34  lung]
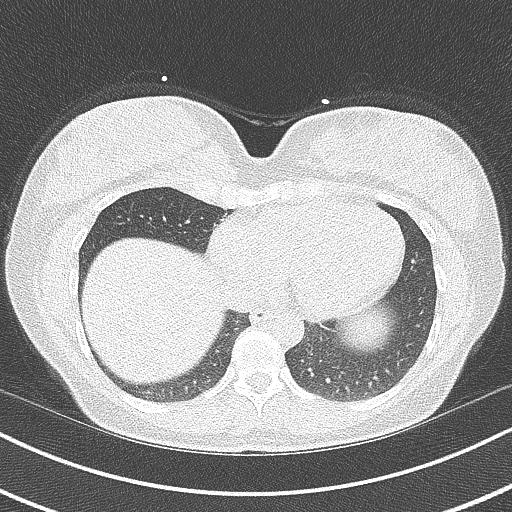
[im 17/34  lung]
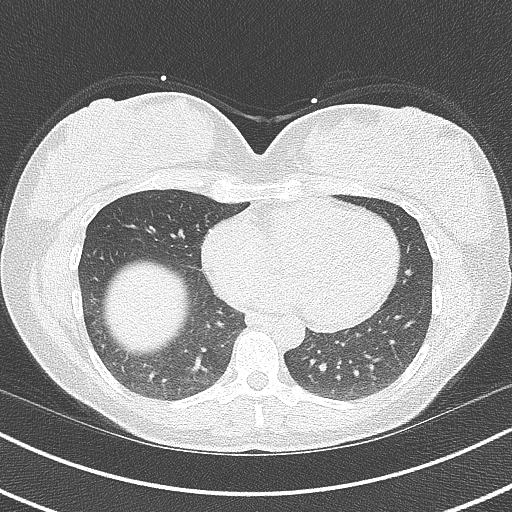
[im 23/34  lung]
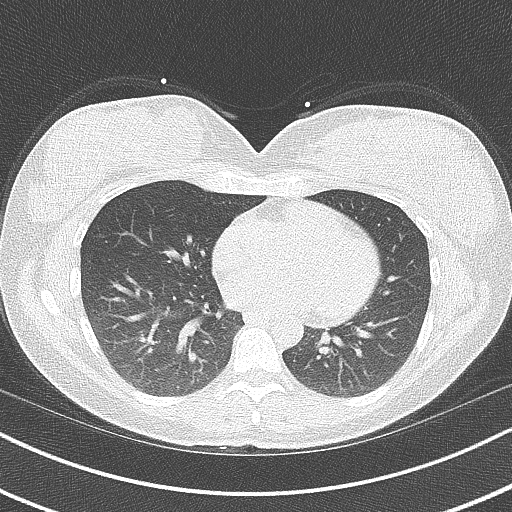
[im 28/34  lung]
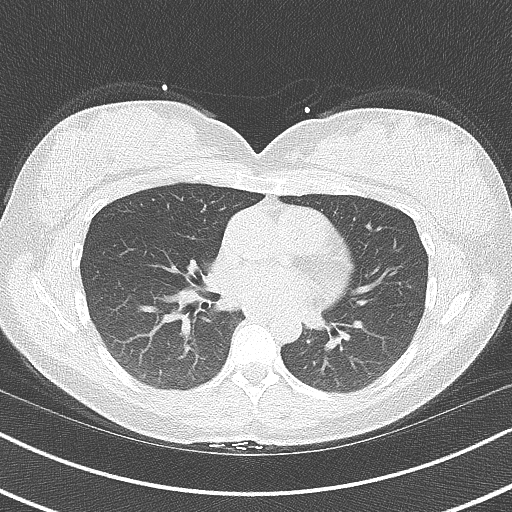

[Series 4: lung st 71 % · axial · 0.67mm/px · z∈[-214,-148]mm · 5 of 34 slices shown]
[im 6/34  lung]
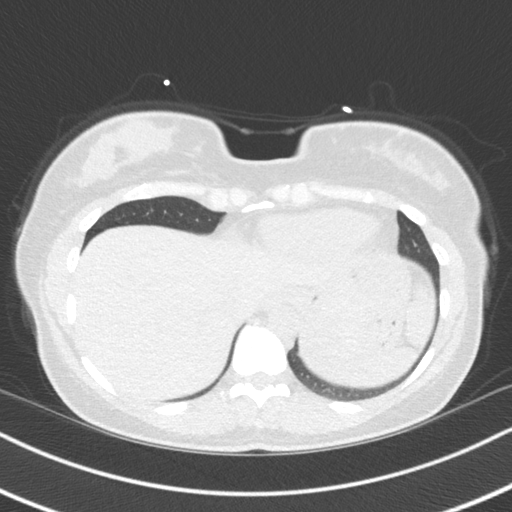
[im 12/34  lung]
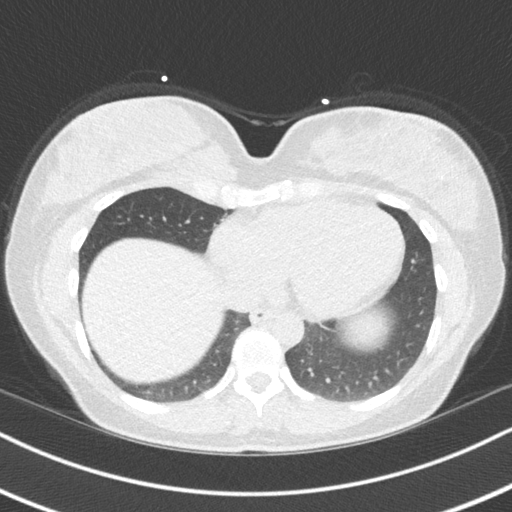
[im 17/34  lung]
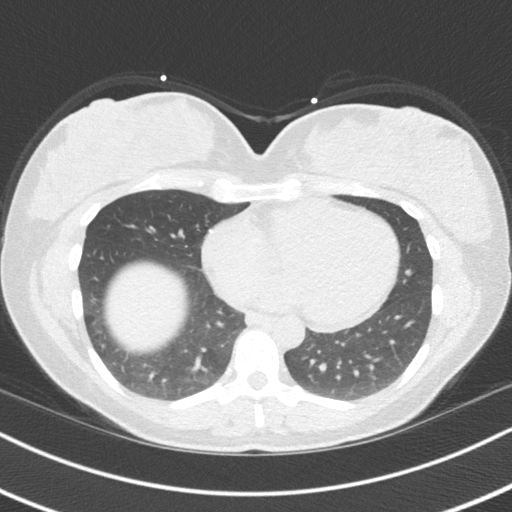
[im 23/34  lung]
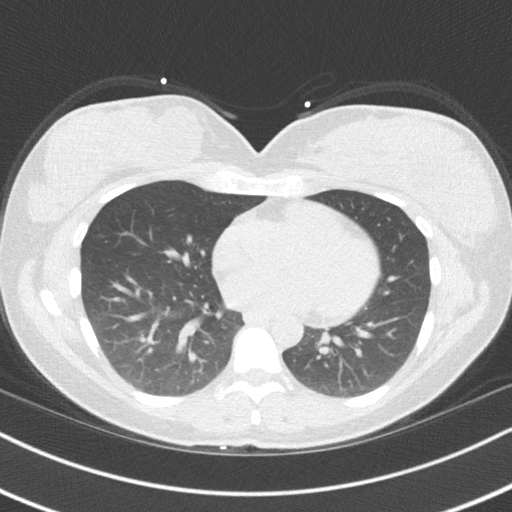
[im 28/34  lung]
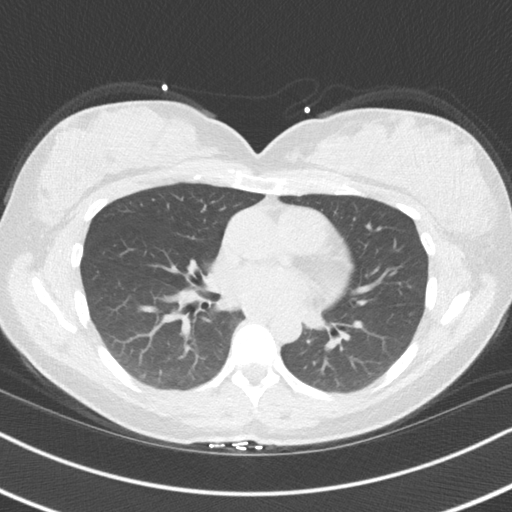

[14 of 20 positions shown; findings below may reference images not displayed]

FINDINGS: Vascular: Normal aortic caliber.

Mediastinum/Nodes: No imaged thoracic adenopathy.

Lungs/Pleura: No pleural fluid.  Clear imaged lungs.

Upper Abdomen: Normal imaged portions of the liver, spleen, stomach.

Musculoskeletal: No acute osseous abnormality.
IMPRESSION: No acute findings in the imaged extracardiac chest.
FINDINGS: Coronary arteries: Normal origins.

Coronary Calcium Score:

Left main: 0

Left anterior descending artery: 0

Left circumflex artery: 0

Right coronary artery: 0

Total: 0

Percentile: 0

Pericardium: Normal.

Ascending Aorta: Normal caliber.

Non-cardiac: See separate report from [REDACTED].
IMPRESSION: Coronary calcium score of 0. This was 0 percentile for age-, race-,
and sex-matched controls.



If CAC=0, it is reasonable to withhold statin therapy and reassess
in 5 to 10 years, as long as higher risk conditions are absent
(diabetes mellitus, family history of premature CHD in first degree
relatives (males <55 years; females <65 years), cigarette smoking,
or LDL >=190 mg/dL).

If CAC is 1 to 99, it is reasonable to initiate statin therapy for
patients >=55 years of age.

If CAC is >=100 or >=75th percentile, it is reasonable to initiate
statin therapy at any age.

Cardiology referral should be considered for patients with CAC
scores >=400 or >=75th percentile.

*6485 AHA/ACC/AACVPR/AAPA/ABC/MOOLMAN/TIGER/JUNIOR/Manuel Walter/ULULU/GALLIMORE/EIMAN
Guideline on the Management of Blood Cholesterol: A Report of the
American College of Cardiology/American Heart Association Task Force
on Clinical Practice Guidelines. J Am Coll Cardiol.
8528;73(24):3528-3147.

*** End of Addendum ***
EXAM:
OVER-READ INTERPRETATION  CT CHEST

The following report is an over-read performed by radiologist Dr.
Barbaro Adele [REDACTED] on 07/29/2020. This over-read
does not include interpretation of cardiac or coronary anatomy or
pathology. The calcium score interpretation by the cardiologist is
attached.
FINDINGS: Vascular: Normal aortic caliber.

Mediastinum/Nodes: No imaged thoracic adenopathy.

Lungs/Pleura: No pleural fluid.  Clear imaged lungs.

Upper Abdomen: Normal imaged portions of the liver, spleen, stomach.

Musculoskeletal: No acute osseous abnormality.
IMPRESSION: No acute findings in the imaged extracardiac chest.

## 2023-04-29 ENCOUNTER — Ambulatory Visit
Admission: RE | Admit: 2023-04-29 | Discharge: 2023-04-29 | Disposition: A | Discharge status: Home / Self Care | Payer: BC Managed Care – PPO | Source: Ambulatory Visit | Attending: Obstetrics and Gynecology | Admitting: Obstetrics and Gynecology

## 2023-04-29 DIAGNOSIS — Z1231 Encounter for screening mammogram for malignant neoplasm of breast: Secondary | ICD-10-CM

## 2023-04-30 ENCOUNTER — Encounter: Payer: Self-pay | Admitting: Obstetrics and Gynecology

## 2023-04-30 DIAGNOSIS — F4322 Adjustment disorder with anxiety: Secondary | ICD-10-CM | POA: Diagnosis not present

## 2023-04-30 DIAGNOSIS — N939 Abnormal uterine and vaginal bleeding, unspecified: Secondary | ICD-10-CM | POA: Diagnosis not present

## 2023-04-30 DIAGNOSIS — Z01419 Encounter for gynecological examination (general) (routine) without abnormal findings: Secondary | ICD-10-CM | POA: Diagnosis not present

## 2023-04-30 DIAGNOSIS — Z1330 Encounter for screening examination for mental health and behavioral disorders, unspecified: Secondary | ICD-10-CM | POA: Diagnosis not present

## 2023-05-02 DIAGNOSIS — N939 Abnormal uterine and vaginal bleeding, unspecified: Secondary | ICD-10-CM | POA: Diagnosis not present

## 2023-05-14 DIAGNOSIS — N939 Abnormal uterine and vaginal bleeding, unspecified: Secondary | ICD-10-CM | POA: Diagnosis not present

## 2023-05-14 DIAGNOSIS — D259 Leiomyoma of uterus, unspecified: Secondary | ICD-10-CM | POA: Diagnosis not present

## 2023-05-20 DIAGNOSIS — Z7251 High risk heterosexual behavior: Secondary | ICD-10-CM | POA: Diagnosis not present

## 2023-05-20 DIAGNOSIS — Z30432 Encounter for removal of intrauterine contraceptive device: Secondary | ICD-10-CM | POA: Diagnosis not present

## 2023-05-20 DIAGNOSIS — Z30430 Encounter for insertion of intrauterine contraceptive device: Secondary | ICD-10-CM | POA: Diagnosis not present

## 2023-05-20 DIAGNOSIS — T8332XA Displacement of intrauterine contraceptive device, initial encounter: Secondary | ICD-10-CM | POA: Diagnosis not present

## 2023-09-16 DIAGNOSIS — D225 Melanocytic nevi of trunk: Secondary | ICD-10-CM | POA: Diagnosis not present

## 2023-09-16 DIAGNOSIS — L578 Other skin changes due to chronic exposure to nonionizing radiation: Secondary | ICD-10-CM | POA: Diagnosis not present

## 2023-09-16 DIAGNOSIS — L821 Other seborrheic keratosis: Secondary | ICD-10-CM | POA: Diagnosis not present

## 2023-09-16 DIAGNOSIS — L814 Other melanin hyperpigmentation: Secondary | ICD-10-CM | POA: Diagnosis not present

## 2023-10-03 DIAGNOSIS — E559 Vitamin D deficiency, unspecified: Secondary | ICD-10-CM | POA: Diagnosis not present

## 2023-10-03 DIAGNOSIS — E785 Hyperlipidemia, unspecified: Secondary | ICD-10-CM | POA: Diagnosis not present

## 2023-10-10 DIAGNOSIS — Z1331 Encounter for screening for depression: Secondary | ICD-10-CM | POA: Diagnosis not present

## 2023-10-10 DIAGNOSIS — Z1339 Encounter for screening examination for other mental health and behavioral disorders: Secondary | ICD-10-CM | POA: Diagnosis not present

## 2023-10-10 DIAGNOSIS — Z23 Encounter for immunization: Secondary | ICD-10-CM | POA: Diagnosis not present

## 2023-10-10 DIAGNOSIS — Z0000 Encounter for general adult medical examination without abnormal findings: Secondary | ICD-10-CM | POA: Diagnosis not present

## 2024-02-25 DIAGNOSIS — Z1211 Encounter for screening for malignant neoplasm of colon: Secondary | ICD-10-CM | POA: Diagnosis not present

## 2024-04-07 ENCOUNTER — Other Ambulatory Visit: Payer: Self-pay | Admitting: Internal Medicine

## 2024-04-07 DIAGNOSIS — Z1231 Encounter for screening mammogram for malignant neoplasm of breast: Secondary | ICD-10-CM

## 2024-04-30 ENCOUNTER — Ambulatory Visit
